# Patient Record
Sex: Male | Born: 1950 | Race: Black or African American | Hispanic: No | Marital: Single | Smoking: Never smoker
Health system: Southern US, Community
[De-identification: ages and names within clinical notes are randomized; demographics above are authoritative.]

## PROBLEM LIST (undated history)

## (undated) DIAGNOSIS — I10 Essential (primary) hypertension: Secondary | ICD-10-CM

## (undated) DIAGNOSIS — C801 Malignant (primary) neoplasm, unspecified: Secondary | ICD-10-CM

## (undated) HISTORY — PX: PROSTATE SURGERY: SHX751

## (undated) HISTORY — PX: BRAIN SURGERY: SHX531

---

## 2006-10-05 ENCOUNTER — Emergency Department: Payer: Self-pay | Admitting: Emergency Medicine

## 2007-08-05 ENCOUNTER — Other Ambulatory Visit: Payer: Self-pay

## 2007-08-05 ENCOUNTER — Emergency Department: Payer: Self-pay | Admitting: Emergency Medicine

## 2008-01-18 ENCOUNTER — Ambulatory Visit: Payer: Self-pay | Admitting: Family Medicine

## 2008-05-02 ENCOUNTER — Emergency Department: Payer: Self-pay | Admitting: Emergency Medicine

## 2008-05-17 ENCOUNTER — Ambulatory Visit: Payer: Self-pay | Admitting: Specialist

## 2008-06-07 ENCOUNTER — Ambulatory Visit: Payer: Self-pay | Admitting: Internal Medicine

## 2008-06-15 ENCOUNTER — Ambulatory Visit: Payer: Self-pay | Admitting: Specialist

## 2008-06-23 ENCOUNTER — Ambulatory Visit: Payer: Self-pay | Admitting: Internal Medicine

## 2008-07-08 ENCOUNTER — Ambulatory Visit: Payer: Self-pay | Admitting: Internal Medicine

## 2008-07-29 ENCOUNTER — Emergency Department: Payer: Self-pay | Admitting: Emergency Medicine

## 2008-07-29 ENCOUNTER — Other Ambulatory Visit: Payer: Self-pay

## 2008-08-08 ENCOUNTER — Ambulatory Visit: Payer: Self-pay | Admitting: Internal Medicine

## 2008-09-05 ENCOUNTER — Emergency Department: Payer: Self-pay | Admitting: Emergency Medicine

## 2008-09-07 ENCOUNTER — Ambulatory Visit: Payer: Self-pay | Admitting: Internal Medicine

## 2008-10-03 ENCOUNTER — Ambulatory Visit: Payer: Self-pay | Admitting: Internal Medicine

## 2008-10-08 ENCOUNTER — Ambulatory Visit: Payer: Self-pay | Admitting: Internal Medicine

## 2009-10-12 ENCOUNTER — Encounter: Payer: Self-pay | Admitting: Internal Medicine

## 2010-05-28 ENCOUNTER — Emergency Department: Payer: Self-pay | Admitting: Emergency Medicine

## 2010-06-18 ENCOUNTER — Observation Stay: Payer: Self-pay | Admitting: Internal Medicine

## 2010-10-24 ENCOUNTER — Ambulatory Visit: Payer: Self-pay

## 2010-11-21 ENCOUNTER — Emergency Department: Payer: Self-pay

## 2011-02-28 ENCOUNTER — Ambulatory Visit: Payer: Self-pay | Admitting: Unknown Physician Specialty

## 2011-03-27 ENCOUNTER — Ambulatory Visit: Payer: Self-pay | Admitting: Unknown Physician Specialty

## 2011-03-28 ENCOUNTER — Ambulatory Visit: Payer: Self-pay | Admitting: Unknown Physician Specialty

## 2011-11-14 ENCOUNTER — Ambulatory Visit: Payer: Self-pay | Admitting: Surgery

## 2011-11-24 ENCOUNTER — Emergency Department: Payer: Self-pay | Admitting: Emergency Medicine

## 2012-07-11 ENCOUNTER — Emergency Department: Payer: Self-pay | Admitting: Unknown Physician Specialty

## 2012-07-11 LAB — CBC WITH DIFFERENTIAL/PLATELET
Basophil #: 0.6 10*3/uL — ABNORMAL HIGH (ref 0.0–0.1)
Basophil %: 3.5 %
Eosinophil #: 0.1 10*3/uL (ref 0.0–0.7)
Eosinophil %: 0.3 %
HCT: 36.1 % — ABNORMAL LOW (ref 40.0–52.0)
HGB: 11.4 g/dL — ABNORMAL LOW (ref 13.0–18.0)
Lymphocyte %: 7.5 %
MCV: 80 fL (ref 80–100)
Neutrophil #: 13 10*3/uL — ABNORMAL HIGH (ref 1.4–6.5)
RDW: 15.2 % — ABNORMAL HIGH (ref 11.5–14.5)
WBC: 15.8 10*3/uL — ABNORMAL HIGH (ref 3.8–10.6)

## 2012-07-11 LAB — PROTIME-INR
INR: 1.1
Prothrombin Time: 14.2 secs (ref 11.5–14.7)

## 2012-07-11 LAB — COMPREHENSIVE METABOLIC PANEL
Anion Gap: 10 (ref 7–16)
Bilirubin,Total: 0.7 mg/dL (ref 0.2–1.0)
Chloride: 103 mmol/L (ref 98–107)
Co2: 26 mmol/L (ref 21–32)
Creatinine: 0.92 mg/dL (ref 0.60–1.30)
EGFR (African American): >60
EGFR (Non-African Amer.): >60
Osmolality: 277 (ref 275–301)
Potassium: 3.7 mmol/L (ref 3.5–5.1)
SGPT (ALT): 35 U/L (ref 12–78)
Sodium: 139 mmol/L (ref 136–145)
Total Protein: 8.6 g/dL — ABNORMAL HIGH (ref 6.4–8.2)

## 2012-07-11 LAB — URINALYSIS, COMPLETE
Bilirubin,UR: NEGATIVE
Glucose,UR: NEGATIVE mg/dL (ref 0–75)
Nitrite: POSITIVE
RBC,UR: 37 /HPF (ref 0–5)
Specific Gravity: 1.009 (ref 1.003–1.030)
Squamous Epithelial: NONE SEEN

## 2012-07-11 LAB — TROPONIN I: Troponin-I: <0.02 ng/mL

## 2012-07-11 LAB — MAGNESIUM: Magnesium: 2 mg/dL

## 2012-07-13 LAB — URINE CULTURE

## 2012-07-17 LAB — CULTURE, BLOOD (SINGLE)

## 2012-08-22 ENCOUNTER — Emergency Department: Payer: Self-pay | Admitting: Emergency Medicine

## 2012-08-22 LAB — URINALYSIS, COMPLETE
Bilirubin,UR: NEGATIVE
Nitrite: NEGATIVE
Ph: 6 (ref 4.5–8.0)
Protein: 30
Squamous Epithelial: 1

## 2012-08-22 LAB — BASIC METABOLIC PANEL
BUN: 11 mg/dL (ref 7–18)
Chloride: 102 mmol/L (ref 98–107)
Creatinine: 0.65 mg/dL (ref 0.60–1.30)
EGFR (African American): >60
EGFR (Non-African Amer.): >60
Glucose: 79 mg/dL (ref 65–99)
Osmolality: 274 (ref 275–301)
Potassium: 3.5 mmol/L (ref 3.5–5.1)
Sodium: 138 mmol/L (ref 136–145)

## 2012-08-22 LAB — CBC
HGB: 10.5 g/dL — ABNORMAL LOW (ref 13.0–18.0)
MCH: 24 pg — ABNORMAL LOW (ref 26.0–34.0)
MCHC: 31.3 g/dL — ABNORMAL LOW (ref 32.0–36.0)
MCV: 77 fL — ABNORMAL LOW (ref 80–100)
Platelet: 330 10*3/uL (ref 150–440)
RDW: 15.7 % — ABNORMAL HIGH (ref 11.5–14.5)

## 2012-08-23 LAB — URINE CULTURE

## 2012-12-10 ENCOUNTER — Emergency Department: Payer: Self-pay | Admitting: Emergency Medicine

## 2012-12-10 LAB — URINALYSIS, COMPLETE
Bilirubin,UR: NEGATIVE
Ketone: NEGATIVE
Nitrite: NEGATIVE
Protein: NEGATIVE
Specific Gravity: 1.008 (ref 1.003–1.030)
WBC UR: 119 /HPF (ref 0–5)

## 2012-12-10 LAB — COMPREHENSIVE METABOLIC PANEL
Albumin: 3 g/dL — ABNORMAL LOW (ref 3.4–5.0)
Alkaline Phosphatase: 125 U/L (ref 50–136)
Bilirubin,Total: 0.3 mg/dL (ref 0.2–1.0)
Calcium, Total: 9 mg/dL (ref 8.5–10.1)
Chloride: 107 mmol/L (ref 98–107)
Co2: 25 mmol/L (ref 21–32)
Creatinine: 1.14 mg/dL (ref 0.60–1.30)
EGFR (African American): >60
EGFR (Non-African Amer.): >60
Osmolality: 276 (ref 275–301)
SGOT(AST): 25 U/L (ref 15–37)
Total Protein: 7.6 g/dL (ref 6.4–8.2)

## 2012-12-10 LAB — CBC
HCT: 29.6 % — ABNORMAL LOW (ref 40.0–52.0)
MCHC: 32 g/dL (ref 32.0–36.0)
MCV: 89 fL (ref 80–100)
Platelet: 339 10*3/uL (ref 150–440)
RDW: 23.8 % — ABNORMAL HIGH (ref 11.5–14.5)

## 2012-12-15 LAB — URINE CULTURE

## 2013-09-01 ENCOUNTER — Ambulatory Visit: Payer: Self-pay | Admitting: Internal Medicine

## 2013-09-02 ENCOUNTER — Ambulatory Visit: Payer: Self-pay | Admitting: Internal Medicine

## 2013-09-07 ENCOUNTER — Ambulatory Visit: Payer: Self-pay | Admitting: Internal Medicine

## 2013-09-16 ENCOUNTER — Ambulatory Visit: Payer: Self-pay | Admitting: Internal Medicine

## 2013-10-08 ENCOUNTER — Ambulatory Visit: Payer: Self-pay | Admitting: Internal Medicine

## 2013-10-08 ENCOUNTER — Inpatient Hospital Stay: Payer: Self-pay | Admitting: Internal Medicine

## 2013-10-08 LAB — DIFFERENTIAL
Basophil #: 0 10*3/uL (ref 0.0–0.1)
Basophil %: 1 %
Eosinophil #: 0 10*3/uL (ref 0.0–0.7)
Eosinophil %: 0.1 %
Lymphocyte %: 50.5 %
Monocyte #: 0.2 x10 3/mm (ref 0.2–1.0)
Monocyte %: 34.7 %
Neutrophil %: 13.7 %

## 2013-10-08 LAB — COMPREHENSIVE METABOLIC PANEL
Albumin: 2.6 g/dL — ABNORMAL LOW (ref 3.4–5.0)
Alkaline Phosphatase: 89 U/L (ref 50–136)
Anion Gap: 10 (ref 7–16)
BUN: 30 mg/dL — ABNORMAL HIGH (ref 7–18)
Bilirubin,Total: 0.6 mg/dL (ref 0.2–1.0)
Chloride: 97 mmol/L — ABNORMAL LOW (ref 98–107)
Co2: 27 mmol/L (ref 21–32)
Creatinine: 2.51 mg/dL — ABNORMAL HIGH (ref 0.60–1.30)
EGFR (African American): 31 — ABNORMAL LOW
EGFR (Non-African Amer.): 27 — ABNORMAL LOW
Glucose: 112 mg/dL — ABNORMAL HIGH (ref 65–99)
Osmolality: 275 (ref 275–301)
Potassium: 3 mmol/L — ABNORMAL LOW (ref 3.5–5.1)
SGOT(AST): 11 U/L — ABNORMAL LOW (ref 15–37)
SGPT (ALT): 23 U/L (ref 12–78)
Total Protein: 6.7 g/dL (ref 6.4–8.2)

## 2013-10-08 LAB — URINALYSIS, COMPLETE
Bilirubin,UR: NEGATIVE
Glucose,UR: NEGATIVE mg/dL (ref 0–75)
Ketone: NEGATIVE
Nitrite: NEGATIVE
Ph: 8 (ref 4.5–8.0)
Protein: 100
RBC,UR: 4 /HPF (ref 0–5)
Specific Gravity: 1.009 (ref 1.003–1.030)
Squamous Epithelial: NONE SEEN

## 2013-10-08 LAB — CBC
HCT: 28 % — ABNORMAL LOW (ref 40.0–52.0)
MCHC: 32.7 g/dL (ref 32.0–36.0)
MCV: 81 fL (ref 80–100)
Platelet: 48 10*3/uL — ABNORMAL LOW (ref 150–440)
RBC: 3.47 10*6/uL — ABNORMAL LOW (ref 4.40–5.90)
RDW: 15.9 % — ABNORMAL HIGH (ref 11.5–14.5)

## 2013-10-08 LAB — ACETAMINOPHEN LEVEL: Acetaminophen: <2 ug/mL

## 2013-10-08 LAB — PRO B NATRIURETIC PEPTIDE: B-Type Natriuretic Peptide: 628 pg/mL — ABNORMAL HIGH (ref 0–125)

## 2013-10-08 LAB — SALICYLATE LEVEL: Salicylates, Serum: <1.7 mg/dL

## 2013-10-08 LAB — PROTIME-INR
INR: 3.4
Prothrombin Time: 33 secs — ABNORMAL HIGH (ref 11.5–14.7)

## 2013-10-08 LAB — TROPONIN I: Troponin-I: 0.04 ng/mL

## 2013-10-09 LAB — CBC WITH DIFFERENTIAL/PLATELET
Basophil %: 0.5 %
Eosinophil %: 0.1 %
HCT: 23.9 % — ABNORMAL LOW (ref 40.0–52.0)
HGB: 7.8 g/dL — ABNORMAL LOW (ref 13.0–18.0)
Lymphocyte %: 27.8 %
MCH: 26.9 pg (ref 26.0–34.0)
MCHC: 32.8 g/dL (ref 32.0–36.0)
MCV: 82 fL (ref 80–100)
Monocyte %: 12.1 %
Neutrophil #: 0.5 10*3/uL — ABNORMAL LOW (ref 1.4–6.5)
WBC: 0.9 10*3/uL — CL (ref 3.8–10.6)

## 2013-10-09 LAB — COMPREHENSIVE METABOLIC PANEL
Albumin: 2.1 g/dL — ABNORMAL LOW (ref 3.4–5.0)
Alkaline Phosphatase: 79 U/L (ref 50–136)
Anion Gap: 5 — ABNORMAL LOW (ref 7–16)
EGFR (Non-African Amer.): 28 — ABNORMAL LOW
Glucose: 78 mg/dL (ref 65–99)
Osmolality: 283 (ref 275–301)
Potassium: 3.7 mmol/L (ref 3.5–5.1)
SGOT(AST): 7 U/L — ABNORMAL LOW (ref 15–37)
SGPT (ALT): 19 U/L (ref 12–78)
Sodium: 140 mmol/L (ref 136–145)

## 2013-10-10 LAB — BASIC METABOLIC PANEL
Anion Gap: 7 (ref 7–16)
BUN: 24 mg/dL — ABNORMAL HIGH (ref 7–18)
Calcium, Total: 7.1 mg/dL — ABNORMAL LOW (ref 8.5–10.1)
Chloride: 111 mmol/L — ABNORMAL HIGH (ref 98–107)
Co2: 22 mmol/L (ref 21–32)
EGFR (African American): 42 — ABNORMAL LOW
EGFR (Non-African Amer.): 36 — ABNORMAL LOW
Glucose: 81 mg/dL (ref 65–99)
Osmolality: 282 (ref 275–301)
Potassium: 3.2 mmol/L — ABNORMAL LOW (ref 3.5–5.1)

## 2013-10-10 LAB — PROTIME-INR
INR: 4.7
Prothrombin Time: 42.4 secs — ABNORMAL HIGH (ref 11.5–14.7)

## 2013-10-11 LAB — CBC WITH DIFFERENTIAL/PLATELET
Bands: 26 %
Basophil: 1 %
HCT: 23.5 % — ABNORMAL LOW (ref 40.0–52.0)
Metamyelocyte: 1 %
Monocytes: 5 %
RDW: 16.2 % — ABNORMAL HIGH (ref 11.5–14.5)
Segmented Neutrophils: 59 %
WBC: 9.2 10*3/uL (ref 3.8–10.6)

## 2013-10-11 LAB — PROTIME-INR: INR: 4

## 2013-10-13 LAB — CULTURE, BLOOD (SINGLE)

## 2013-10-13 LAB — URINE CULTURE

## 2013-10-22 ENCOUNTER — Emergency Department: Payer: Self-pay | Admitting: Internal Medicine

## 2013-10-26 ENCOUNTER — Ambulatory Visit: Payer: Self-pay | Admitting: Internal Medicine

## 2013-11-07 ENCOUNTER — Ambulatory Visit: Payer: Self-pay | Admitting: Internal Medicine

## 2013-12-08 ENCOUNTER — Ambulatory Visit: Payer: Self-pay | Admitting: Internal Medicine

## 2013-12-08 ENCOUNTER — Ambulatory Visit: Payer: Self-pay | Admitting: Gynecologic Oncology

## 2013-12-20 LAB — CBC CANCER CENTER
Basophil #: 0 x10 3/mm (ref 0.0–0.1)
Basophil %: 0.3 %
Eosinophil #: 0 x10 3/mm (ref 0.0–0.7)
Eosinophil %: 0.6 %
HCT: 32.2 % — AB (ref 40.0–52.0)
HGB: 10.4 g/dL — AB (ref 13.0–18.0)
LYMPHS ABS: 1.3 x10 3/mm (ref 1.0–3.6)
Lymphocyte %: 18 %
MCH: 28.1 pg (ref 26.0–34.0)
MCHC: 32.2 g/dL (ref 32.0–36.0)
MCV: 87 fL (ref 80–100)
MONOS PCT: 11.1 %
Monocyte #: 0.8 x10 3/mm (ref 0.2–1.0)
NEUTROS ABS: 5 x10 3/mm (ref 1.4–6.5)
NEUTROS PCT: 70 %
PLATELETS: 196 x10 3/mm (ref 150–440)
RBC: 3.7 10*6/uL — ABNORMAL LOW (ref 4.40–5.90)
RDW: 18.3 % — ABNORMAL HIGH (ref 11.5–14.5)
WBC: 7.1 x10 3/mm (ref 3.8–10.6)

## 2013-12-20 LAB — COMPREHENSIVE METABOLIC PANEL
Albumin: 3.5 g/dL (ref 3.4–5.0)
Alkaline Phosphatase: 147 U/L — ABNORMAL HIGH
Anion Gap: 5 — ABNORMAL LOW (ref 7–16)
BUN: 23 mg/dL — ABNORMAL HIGH (ref 7–18)
Bilirubin,Total: 0.2 mg/dL (ref 0.2–1.0)
CALCIUM: 9.6 mg/dL (ref 8.5–10.1)
CO2: 27 mmol/L (ref 21–32)
Chloride: 109 mmol/L — ABNORMAL HIGH (ref 98–107)
Creatinine: 1.64 mg/dL — ABNORMAL HIGH (ref 0.60–1.30)
EGFR (African American): 52 — ABNORMAL LOW
EGFR (Non-African Amer.): 44 — ABNORMAL LOW
Glucose: 88 mg/dL (ref 65–99)
OSMOLALITY: 284 (ref 275–301)
POTASSIUM: 4.2 mmol/L (ref 3.5–5.1)
SGOT(AST): 15 U/L (ref 15–37)
SGPT (ALT): 18 U/L (ref 12–78)
Sodium: 141 mmol/L (ref 136–145)
Total Protein: 7.7 g/dL (ref 6.4–8.2)

## 2013-12-27 LAB — CBC CANCER CENTER
BASOS ABS: 0 x10 3/mm (ref 0.0–0.1)
Basophil %: 0.9 %
Eosinophil #: 0 x10 3/mm (ref 0.0–0.7)
Eosinophil %: 0.8 %
HCT: 32.7 % — AB (ref 40.0–52.0)
HGB: 10.4 g/dL — ABNORMAL LOW (ref 13.0–18.0)
Lymphocyte #: 1.2 x10 3/mm (ref 1.0–3.6)
Lymphocyte %: 29.8 %
MCH: 27.6 pg (ref 26.0–34.0)
MCHC: 31.7 g/dL — ABNORMAL LOW (ref 32.0–36.0)
MCV: 87 fL (ref 80–100)
Monocyte #: 0.1 x10 3/mm — ABNORMAL LOW (ref 0.2–1.0)
Monocyte %: 3.5 %
Neutrophil #: 2.7 x10 3/mm (ref 1.4–6.5)
Neutrophil %: 65 %
PLATELETS: 132 x10 3/mm — AB (ref 150–440)
RBC: 3.75 10*6/uL — ABNORMAL LOW (ref 4.40–5.90)
RDW: 17.6 % — ABNORMAL HIGH (ref 11.5–14.5)
WBC: 4.1 x10 3/mm (ref 3.8–10.6)

## 2014-01-02 ENCOUNTER — Emergency Department: Payer: Self-pay | Admitting: Emergency Medicine

## 2014-01-02 LAB — URINALYSIS, COMPLETE
Bilirubin,UR: NEGATIVE
Glucose,UR: NEGATIVE mg/dL (ref 0–75)
Ketone: NEGATIVE
Nitrite: NEGATIVE
PH: 7 (ref 4.5–8.0)
Protein: 30
RBC,UR: 6 /HPF (ref 0–5)
Specific Gravity: 1.011 (ref 1.003–1.030)
WBC UR: 171 /HPF (ref 0–5)

## 2014-01-02 LAB — CBC WITH DIFFERENTIAL/PLATELET
Basophil #: 0 10*3/uL (ref 0.0–0.1)
Basophil %: 0.9 %
EOS PCT: 1.3 %
Eosinophil #: 0 10*3/uL (ref 0.0–0.7)
Eosinophil: 1 %
HCT: 31 % — ABNORMAL LOW (ref 40.0–52.0)
HGB: 10 g/dL — AB (ref 13.0–18.0)
LYMPHS PCT: 43.3 %
LYMPHS PCT: 47 %
Lymphocyte #: 1.5 10*3/uL (ref 1.0–3.6)
MCH: 28.3 pg (ref 26.0–34.0)
MCHC: 32.4 g/dL (ref 32.0–36.0)
MCV: 87 fL (ref 80–100)
MONOS PCT: 28.1 %
Monocyte #: 1 x10 3/mm (ref 0.2–1.0)
Monocytes: 23 %
NEUTROS PCT: 26.4 %
Neutrophil #: 0.9 10*3/uL — ABNORMAL LOW (ref 1.4–6.5)
Platelet: 175 10*3/uL (ref 150–440)
RBC: 3.55 10*6/uL — ABNORMAL LOW (ref 4.40–5.90)
RDW: 17.7 % — AB (ref 11.5–14.5)
Segmented Neutrophils: 29 %
WBC: 3.4 10*3/uL — ABNORMAL LOW (ref 3.8–10.6)

## 2014-01-02 LAB — COMPREHENSIVE METABOLIC PANEL
ALK PHOS: 126 U/L — AB
ALT: 23 U/L (ref 12–78)
ANION GAP: 6 — AB (ref 7–16)
Albumin: 3.4 g/dL (ref 3.4–5.0)
BILIRUBIN TOTAL: 0.2 mg/dL (ref 0.2–1.0)
BUN: 21 mg/dL — AB (ref 7–18)
CO2: 24 mmol/L (ref 21–32)
CREATININE: 1.52 mg/dL — AB (ref 0.60–1.30)
Calcium, Total: 9.4 mg/dL (ref 8.5–10.1)
Chloride: 105 mmol/L (ref 98–107)
EGFR (Non-African Amer.): 49 — ABNORMAL LOW
GFR CALC AF AMER: 57 — AB
GLUCOSE: 109 mg/dL — AB (ref 65–99)
Osmolality: 274 (ref 275–301)
Potassium: 3.8 mmol/L (ref 3.5–5.1)
SGOT(AST): 22 U/L (ref 15–37)
SODIUM: 135 mmol/L — AB (ref 136–145)
Total Protein: 7.8 g/dL (ref 6.4–8.2)

## 2014-01-02 LAB — TROPONIN I: Troponin-I: <0.02 ng/mL

## 2014-01-03 LAB — CREATININE, SERUM
Creatinine: 1.47 mg/dL — ABNORMAL HIGH (ref 0.60–1.30)
EGFR (African American): 59 — ABNORMAL LOW
EGFR (Non-African Amer.): 51 — ABNORMAL LOW

## 2014-01-03 LAB — CBC CANCER CENTER
BASOS ABS: 0 x10 3/mm (ref 0.0–0.1)
Basophil %: 0.8 %
EOS ABS: 0 x10 3/mm (ref 0.0–0.7)
Eosinophil %: 1.1 %
HCT: 30.3 % — AB (ref 40.0–52.0)
HGB: 9.6 g/dL — ABNORMAL LOW (ref 13.0–18.0)
LYMPHS ABS: 1.3 x10 3/mm (ref 1.0–3.6)
LYMPHS PCT: 41 %
MCH: 27.4 pg (ref 26.0–34.0)
MCHC: 31.5 g/dL — ABNORMAL LOW (ref 32.0–36.0)
MCV: 87 fL (ref 80–100)
Monocyte #: 0.7 x10 3/mm (ref 0.2–1.0)
Monocyte %: 21.2 %
Neutrophil #: 1.2 x10 3/mm — ABNORMAL LOW (ref 1.4–6.5)
Neutrophil %: 35.9 %
PLATELETS: 168 x10 3/mm (ref 150–440)
RBC: 3.49 10*6/uL — ABNORMAL LOW (ref 4.40–5.90)
RDW: 17.8 % — AB (ref 11.5–14.5)
WBC: 3.3 x10 3/mm — ABNORMAL LOW (ref 3.8–10.6)

## 2014-01-03 LAB — HEPATIC FUNCTION PANEL A (ARMC)
ALK PHOS: 117 U/L
Albumin: 3.2 g/dL — ABNORMAL LOW (ref 3.4–5.0)
BILIRUBIN TOTAL: 0.2 mg/dL (ref 0.2–1.0)
Bilirubin, Direct: 0.1 mg/dL (ref 0.00–0.20)
SGOT(AST): 17 U/L (ref 15–37)
SGPT (ALT): 21 U/L (ref 12–78)
Total Protein: 7.5 g/dL (ref 6.4–8.2)

## 2014-01-04 LAB — URINE CULTURE

## 2014-01-08 ENCOUNTER — Ambulatory Visit: Payer: Self-pay | Admitting: Internal Medicine

## 2014-01-10 LAB — BASIC METABOLIC PANEL
ANION GAP: 13 (ref 7–16)
BUN: 17 mg/dL (ref 7–18)
CO2: 24 mmol/L (ref 21–32)
Calcium, Total: 8.6 mg/dL (ref 8.5–10.1)
Chloride: 105 mmol/L (ref 98–107)
Creatinine: 1.56 mg/dL — ABNORMAL HIGH (ref 0.60–1.30)
GFR CALC AF AMER: 55 — AB
GFR CALC NON AF AMER: 47 — AB
GLUCOSE: 121 mg/dL — AB (ref 65–99)
OSMOLALITY: 286 (ref 275–301)
Potassium: 3.5 mmol/L (ref 3.5–5.1)
Sodium: 142 mmol/L (ref 136–145)

## 2014-01-10 LAB — URINALYSIS, COMPLETE
Bilirubin,UR: NEGATIVE
Glucose,UR: NEGATIVE mg/dL (ref 0–75)
KETONE: NEGATIVE
Nitrite: NEGATIVE
PH: 7 (ref 4.5–8.0)
Protein: 30
SQUAMOUS EPITHELIAL: NONE SEEN
Specific Gravity: 1.01 (ref 1.003–1.030)
WBC UR: 1155 /HPF (ref 0–5)

## 2014-01-10 LAB — CBC CANCER CENTER
BASOS PCT: 0.3 %
Basophil #: 0 x10 3/mm (ref 0.0–0.1)
EOS PCT: 0.6 %
Eosinophil #: 0 x10 3/mm (ref 0.0–0.7)
HCT: 28.9 % — ABNORMAL LOW (ref 40.0–52.0)
HGB: 9.1 g/dL — ABNORMAL LOW (ref 13.0–18.0)
LYMPHS ABS: 1.6 x10 3/mm (ref 1.0–3.6)
LYMPHS PCT: 24.7 %
MCH: 27.4 pg (ref 26.0–34.0)
MCHC: 31.4 g/dL — ABNORMAL LOW (ref 32.0–36.0)
MCV: 87 fL (ref 80–100)
MONOS PCT: 9.4 %
Monocyte #: 0.6 x10 3/mm (ref 0.2–1.0)
NEUTROS PCT: 65 %
Neutrophil #: 4.1 x10 3/mm (ref 1.4–6.5)
Platelet: 181 x10 3/mm (ref 150–440)
RBC: 3.31 10*6/uL — ABNORMAL LOW (ref 4.40–5.90)
RDW: 18.7 % — ABNORMAL HIGH (ref 11.5–14.5)
WBC: 6.4 x10 3/mm (ref 3.8–10.6)

## 2014-01-12 ENCOUNTER — Inpatient Hospital Stay: Payer: Self-pay | Admitting: Internal Medicine

## 2014-01-12 LAB — CBC WITH DIFFERENTIAL/PLATELET
BASOS ABS: 0 10*3/uL (ref 0.0–0.1)
Basophil %: 0.4 %
Eosinophil #: 0 10*3/uL (ref 0.0–0.7)
Eosinophil %: 0 %
HCT: 26.7 % — AB (ref 40.0–52.0)
HGB: 8.7 g/dL — ABNORMAL LOW (ref 13.0–18.0)
Lymphocyte #: 0.9 10*3/uL — ABNORMAL LOW (ref 1.0–3.6)
Lymphocyte %: 10.5 %
MCH: 28.3 pg (ref 26.0–34.0)
MCHC: 32.5 g/dL (ref 32.0–36.0)
MCV: 87 fL (ref 80–100)
MONOS PCT: 6 %
Monocyte #: 0.5 x10 3/mm (ref 0.2–1.0)
Neutrophil #: 7.4 10*3/uL — ABNORMAL HIGH (ref 1.4–6.5)
Neutrophil %: 83.1 %
PLATELETS: 162 10*3/uL (ref 150–440)
RBC: 3.06 10*6/uL — AB (ref 4.40–5.90)
RDW: 18.2 % — ABNORMAL HIGH (ref 11.5–14.5)
WBC: 8.9 10*3/uL (ref 3.8–10.6)

## 2014-01-12 LAB — COMPREHENSIVE METABOLIC PANEL
ALBUMIN: 3.1 g/dL — AB (ref 3.4–5.0)
ALT: 27 U/L (ref 12–78)
ANION GAP: 6 — AB (ref 7–16)
Alkaline Phosphatase: 115 U/L
BILIRUBIN TOTAL: 0.2 mg/dL (ref 0.2–1.0)
BUN: 27 mg/dL — ABNORMAL HIGH (ref 7–18)
CALCIUM: 9 mg/dL (ref 8.5–10.1)
Chloride: 106 mmol/L (ref 98–107)
Co2: 28 mmol/L (ref 21–32)
Creatinine: 1.8 mg/dL — ABNORMAL HIGH (ref 0.60–1.30)
GFR CALC AF AMER: 46 — AB
GFR CALC NON AF AMER: 40 — AB
Glucose: 103 mg/dL — ABNORMAL HIGH (ref 65–99)
Osmolality: 285 (ref 275–301)
Potassium: 3.6 mmol/L (ref 3.5–5.1)
SGOT(AST): 36 U/L (ref 15–37)
Sodium: 140 mmol/L (ref 136–145)
TOTAL PROTEIN: 7.1 g/dL (ref 6.4–8.2)

## 2014-01-12 LAB — URINALYSIS, COMPLETE
Bilirubin,UR: NEGATIVE
Glucose,UR: NEGATIVE mg/dL (ref 0–75)
KETONE: NEGATIVE
NITRITE: NEGATIVE
PROTEIN: NEGATIVE
Ph: 7 (ref 4.5–8.0)
Specific Gravity: 1.01 (ref 1.003–1.030)
Squamous Epithelial: <1
WBC UR: 178 /HPF (ref 0–5)

## 2014-01-12 LAB — APTT: ACTIVATED PTT: 37.7 s — AB (ref 23.6–35.9)

## 2014-01-12 LAB — PROTIME-INR
INR: 2.1
Prothrombin Time: 23.4 secs — ABNORMAL HIGH (ref 11.5–14.7)

## 2014-01-12 LAB — HEMATOCRIT
HCT: 25.3 % — AB (ref 40.0–52.0)
HCT: 24.3 % — ABNORMAL LOW (ref 40.0–52.0)

## 2014-01-12 LAB — LIPASE, BLOOD: LIPASE: 61 U/L — AB (ref 73–393)

## 2014-01-12 LAB — HEMOGLOBIN
HGB: 8.1 g/dL — AB (ref 13.0–18.0)
HGB: 7.9 g/dL — AB (ref 13.0–18.0)
HGB: 8.3 g/dL — AB (ref 13.0–18.0)

## 2014-01-12 LAB — URINE CULTURE

## 2014-01-13 LAB — BASIC METABOLIC PANEL
Anion Gap: 2 — ABNORMAL LOW (ref 7–16)
BUN: 15 mg/dL (ref 7–18)
CHLORIDE: 106 mmol/L (ref 98–107)
Calcium, Total: 8.4 mg/dL — ABNORMAL LOW (ref 8.5–10.1)
Co2: 28 mmol/L (ref 21–32)
Creatinine: 1.34 mg/dL — ABNORMAL HIGH (ref 0.60–1.30)
EGFR (Non-African Amer.): 57 — ABNORMAL LOW
GLUCOSE: 115 mg/dL — AB (ref 65–99)
Osmolality: 274 (ref 275–301)
Potassium: 4 mmol/L (ref 3.5–5.1)
Sodium: 136 mmol/L (ref 136–145)

## 2014-01-13 LAB — PROTIME-INR
INR: 2.3
Prothrombin Time: 25.1 secs — ABNORMAL HIGH (ref 11.5–14.7)

## 2014-01-13 LAB — CBC WITH DIFFERENTIAL/PLATELET
Basophil #: 0 10*3/uL (ref 0.0–0.1)
Basophil %: 0.6 %
EOS ABS: 0 10*3/uL (ref 0.0–0.7)
EOS PCT: 0.5 %
HCT: 25.4 % — ABNORMAL LOW (ref 40.0–52.0)
HGB: 8.4 g/dL — ABNORMAL LOW (ref 13.0–18.0)
LYMPHS ABS: 1.2 10*3/uL (ref 1.0–3.6)
LYMPHS PCT: 34.3 %
MCH: 28.8 pg (ref 26.0–34.0)
MCHC: 32.9 g/dL (ref 32.0–36.0)
MCV: 88 fL (ref 80–100)
MONO ABS: 0.1 x10 3/mm — AB (ref 0.2–1.0)
Monocyte %: 2.6 %
NEUTROS PCT: 62 %
Neutrophil #: 2.2 10*3/uL (ref 1.4–6.5)
Platelet: 133 10*3/uL — ABNORMAL LOW (ref 150–440)
RBC: 2.9 10*6/uL — AB (ref 4.40–5.90)
RDW: 18.8 % — ABNORMAL HIGH (ref 11.5–14.5)
WBC: 3.5 10*3/uL — ABNORMAL LOW (ref 3.8–10.6)

## 2014-01-13 LAB — HEMOGLOBIN: HGB: 8.6 g/dL — ABNORMAL LOW (ref 13.0–18.0)

## 2014-01-13 LAB — MAGNESIUM: Magnesium: 1.3 mg/dL — ABNORMAL LOW

## 2014-01-14 LAB — CBC WITH DIFFERENTIAL/PLATELET
Basophil #: 0 10*3/uL (ref 0.0–0.1)
Basophil %: 0.8 %
Eosinophil #: 0 10*3/uL (ref 0.0–0.7)
Eosinophil %: 0.8 %
HCT: 28.9 % — ABNORMAL LOW (ref 40.0–52.0)
HGB: 9.2 g/dL — ABNORMAL LOW (ref 13.0–18.0)
Lymphocyte #: 0.9 10*3/uL — ABNORMAL LOW (ref 1.0–3.6)
Lymphocyte %: 18 %
MCH: 27.7 pg (ref 26.0–34.0)
MCHC: 31.9 g/dL — AB (ref 32.0–36.0)
MCV: 87 fL (ref 80–100)
Monocyte #: 0 x10 3/mm — ABNORMAL LOW (ref 0.2–1.0)
Monocyte %: 0.9 %
NEUTROS ABS: 3.8 10*3/uL (ref 1.4–6.5)
Neutrophil %: 79.5 %
PLATELETS: 141 10*3/uL — AB (ref 150–440)
RBC: 3.33 10*6/uL — ABNORMAL LOW (ref 4.40–5.90)
RDW: 18.6 % — ABNORMAL HIGH (ref 11.5–14.5)
WBC: 4.8 10*3/uL (ref 3.8–10.6)

## 2014-01-15 LAB — PROTIME-INR
INR: 1.9
Prothrombin Time: 21.4 secs — ABNORMAL HIGH (ref 11.5–14.7)

## 2014-01-15 LAB — URINE CULTURE

## 2014-01-17 ENCOUNTER — Ambulatory Visit: Payer: Self-pay | Admitting: Gynecologic Oncology

## 2014-01-17 LAB — CBC CANCER CENTER
Basophil #: 0 x10 3/mm (ref 0.0–0.1)
Basophil %: 0.7 %
Eosinophil #: 0 x10 3/mm (ref 0.0–0.7)
Eosinophil %: 0.8 %
HCT: 29.9 % — ABNORMAL LOW (ref 40.0–52.0)
HGB: 9.5 g/dL — ABNORMAL LOW (ref 13.0–18.0)
Lymphocyte #: 1.3 x10 3/mm (ref 1.0–3.6)
Lymphocyte %: 39.2 %
MCH: 27.4 pg (ref 26.0–34.0)
MCHC: 31.9 g/dL — ABNORMAL LOW (ref 32.0–36.0)
MCV: 86 fL (ref 80–100)
Monocyte #: 0.1 x10 3/mm — ABNORMAL LOW (ref 0.2–1.0)
Monocyte %: 1.9 %
Neutrophil #: 1.9 x10 3/mm (ref 1.4–6.5)
Neutrophil %: 57.4 %
Platelet: 116 x10 3/mm — ABNORMAL LOW (ref 150–440)
RBC: 3.47 10*6/uL — ABNORMAL LOW (ref 4.40–5.90)
RDW: 18.4 % — ABNORMAL HIGH (ref 11.5–14.5)
WBC: 3.3 x10 3/mm — ABNORMAL LOW (ref 3.8–10.6)

## 2014-01-31 LAB — HEPATIC FUNCTION PANEL A (ARMC)
ALK PHOS: 136 U/L — AB
ALT: 30 U/L (ref 12–78)
AST: 22 U/L (ref 15–37)
Albumin: 3.2 g/dL — ABNORMAL LOW (ref 3.4–5.0)
Bilirubin, Direct: 0.1 mg/dL (ref 0.00–0.20)
Bilirubin,Total: 0.2 mg/dL (ref 0.2–1.0)
Total Protein: 7.2 g/dL (ref 6.4–8.2)

## 2014-01-31 LAB — CBC CANCER CENTER
Basophil #: 0 x10 3/mm (ref 0.0–0.1)
Basophil %: 0.2 %
EOS PCT: 0.1 %
Eosinophil #: 0 x10 3/mm (ref 0.0–0.7)
HCT: 30.8 % — ABNORMAL LOW (ref 40.0–52.0)
HGB: 9.6 g/dL — AB (ref 13.0–18.0)
Lymphocyte #: 0.7 x10 3/mm — ABNORMAL LOW (ref 1.0–3.6)
Lymphocyte %: 17.3 %
MCH: 28.1 pg (ref 26.0–34.0)
MCHC: 31.1 g/dL — AB (ref 32.0–36.0)
MCV: 91 fL (ref 80–100)
Monocyte #: 0.1 x10 3/mm — ABNORMAL LOW (ref 0.2–1.0)
Monocyte %: 1.6 %
NEUTROS PCT: 80.8 %
Neutrophil #: 3.1 x10 3/mm (ref 1.4–6.5)
Platelet: 178 x10 3/mm (ref 150–440)
RBC: 3.4 10*6/uL — AB (ref 4.40–5.90)
RDW: 20.9 % — ABNORMAL HIGH (ref 11.5–14.5)
WBC: 3.8 x10 3/mm (ref 3.8–10.6)

## 2014-01-31 LAB — BASIC METABOLIC PANEL
Anion Gap: 15 (ref 7–16)
BUN: 14 mg/dL (ref 7–18)
CO2: 22 mmol/L (ref 21–32)
CREATININE: 1.82 mg/dL — AB (ref 0.60–1.30)
Calcium, Total: 8 mg/dL — ABNORMAL LOW (ref 8.5–10.1)
Chloride: 105 mmol/L (ref 98–107)
EGFR (African American): 45 — ABNORMAL LOW
EGFR (Non-African Amer.): 39 — ABNORMAL LOW
Glucose: 176 mg/dL — ABNORMAL HIGH (ref 65–99)
OSMOLALITY: 288 (ref 275–301)
POTASSIUM: 3.6 mmol/L (ref 3.5–5.1)
SODIUM: 142 mmol/L (ref 136–145)

## 2014-02-05 ENCOUNTER — Ambulatory Visit: Payer: Self-pay | Admitting: Internal Medicine

## 2014-02-05 ENCOUNTER — Ambulatory Visit: Payer: Self-pay | Admitting: Gynecologic Oncology

## 2014-02-07 LAB — CBC CANCER CENTER
BASOS PCT: 0.8 %
Basophil #: 0 x10 3/mm (ref 0.0–0.1)
EOS PCT: 1.1 %
Eosinophil #: 0 x10 3/mm (ref 0.0–0.7)
HCT: 31.5 % — ABNORMAL LOW (ref 40.0–52.0)
HGB: 9.9 g/dL — ABNORMAL LOW (ref 13.0–18.0)
LYMPHS ABS: 1.1 x10 3/mm (ref 1.0–3.6)
LYMPHS PCT: 44.8 %
MCH: 28 pg (ref 26.0–34.0)
MCHC: 31.6 g/dL — AB (ref 32.0–36.0)
MCV: 89 fL (ref 80–100)
MONO ABS: 0.1 x10 3/mm — AB (ref 0.2–1.0)
MONOS PCT: 3.2 %
NEUTROS PCT: 50.1 %
Neutrophil #: 1.3 x10 3/mm — ABNORMAL LOW (ref 1.4–6.5)
Platelet: 110 x10 3/mm — ABNORMAL LOW (ref 150–440)
RBC: 3.55 10*6/uL — ABNORMAL LOW (ref 4.40–5.90)
RDW: 20 % — ABNORMAL HIGH (ref 11.5–14.5)
WBC: 2.5 x10 3/mm — ABNORMAL LOW (ref 3.8–10.6)

## 2014-02-14 LAB — CBC CANCER CENTER
Basophil #: 0 x10 3/mm (ref 0.0–0.1)
Basophil %: 0.3 %
Eosinophil #: 0 x10 3/mm (ref 0.0–0.7)
Eosinophil %: 0.8 %
HCT: 27.6 % — ABNORMAL LOW (ref 40.0–52.0)
HGB: 8.6 g/dL — AB (ref 13.0–18.0)
Lymphocyte #: 1.2 x10 3/mm (ref 1.0–3.6)
Lymphocyte %: 39.6 %
MCH: 27.4 pg (ref 26.0–34.0)
MCHC: 31.2 g/dL — ABNORMAL LOW (ref 32.0–36.0)
MCV: 88 fL (ref 80–100)
MONO ABS: 0.5 x10 3/mm (ref 0.2–1.0)
MONOS PCT: 18.2 %
NEUTROS ABS: 1.2 x10 3/mm — AB (ref 1.4–6.5)
Neutrophil %: 41.1 %
PLATELETS: 174 x10 3/mm (ref 150–440)
RBC: 3.14 10*6/uL — ABNORMAL LOW (ref 4.40–5.90)
RDW: 19.9 % — ABNORMAL HIGH (ref 11.5–14.5)
WBC: 3 x10 3/mm — ABNORMAL LOW (ref 3.8–10.6)

## 2014-02-14 LAB — HEPATIC FUNCTION PANEL A (ARMC)
ALK PHOS: 112 U/L
ALT: 19 U/L (ref 12–78)
AST: 16 U/L (ref 15–37)
Albumin: 3 g/dL — ABNORMAL LOW (ref 3.4–5.0)
BILIRUBIN TOTAL: 0.3 mg/dL (ref 0.2–1.0)
Bilirubin, Direct: 0.1 mg/dL (ref 0.00–0.20)
Total Protein: 7.2 g/dL (ref 6.4–8.2)

## 2014-02-14 LAB — CREATININE, SERUM
Creatinine: 1.53 mg/dL — ABNORMAL HIGH (ref 0.60–1.30)
EGFR (African American): 56 — ABNORMAL LOW
EGFR (Non-African Amer.): 48 — ABNORMAL LOW

## 2014-02-21 LAB — CBC CANCER CENTER
Basophil #: 0 x10 3/mm (ref 0.0–0.1)
Basophil %: 0.4 %
Eosinophil #: 0 x10 3/mm (ref 0.0–0.7)
Eosinophil %: 0 %
HCT: 31.1 % — ABNORMAL LOW (ref 40.0–52.0)
HGB: 9.7 g/dL — ABNORMAL LOW (ref 13.0–18.0)
LYMPHS ABS: 0.6 x10 3/mm — AB (ref 1.0–3.6)
LYMPHS PCT: 11 %
MCH: 27.7 pg (ref 26.0–34.0)
MCHC: 31.2 g/dL — ABNORMAL LOW (ref 32.0–36.0)
MCV: 89 fL (ref 80–100)
MONO ABS: 0 x10 3/mm — AB (ref 0.2–1.0)
Monocyte %: 0.8 %
NEUTROS ABS: 4.8 x10 3/mm (ref 1.4–6.5)
NEUTROS PCT: 87.8 %
Platelet: 221 x10 3/mm (ref 150–440)
RBC: 3.51 10*6/uL — AB (ref 4.40–5.90)
RDW: 20.5 % — ABNORMAL HIGH (ref 11.5–14.5)
WBC: 5.4 x10 3/mm (ref 3.8–10.6)

## 2014-02-21 LAB — BASIC METABOLIC PANEL
ANION GAP: 14 (ref 7–16)
BUN: 17 mg/dL (ref 7–18)
CALCIUM: 9.5 mg/dL (ref 8.5–10.1)
CO2: 23 mmol/L (ref 21–32)
Chloride: 103 mmol/L (ref 98–107)
Creatinine: 1.65 mg/dL — ABNORMAL HIGH (ref 0.60–1.30)
EGFR (African American): 51 — ABNORMAL LOW
EGFR (Non-African Amer.): 44 — ABNORMAL LOW
Glucose: 157 mg/dL — ABNORMAL HIGH (ref 65–99)
Osmolality: 284 (ref 275–301)
POTASSIUM: 4.3 mmol/L (ref 3.5–5.1)
SODIUM: 140 mmol/L (ref 136–145)

## 2014-02-27 ENCOUNTER — Inpatient Hospital Stay: Payer: Self-pay | Admitting: Internal Medicine

## 2014-02-27 LAB — URINALYSIS, COMPLETE
Bilirubin,UR: NEGATIVE
GLUCOSE, UR: NEGATIVE mg/dL (ref 0–75)
KETONE: NEGATIVE
Nitrite: NEGATIVE
Ph: 7 (ref 4.5–8.0)
RBC,UR: 411 /HPF (ref 0–5)
Specific Gravity: 1.013 (ref 1.003–1.030)
Squamous Epithelial: 1
WBC UR: 254 /HPF (ref 0–5)

## 2014-02-27 LAB — PROTIME-INR
INR: 5.5
PROTHROMBIN TIME: 48.4 s — AB (ref 11.5–14.7)

## 2014-02-27 LAB — CBC
HCT: 30.3 % — ABNORMAL LOW (ref 40.0–52.0)
HGB: 9.5 g/dL — ABNORMAL LOW (ref 13.0–18.0)
MCH: 27.8 pg (ref 26.0–34.0)
MCHC: 31.3 g/dL — AB (ref 32.0–36.0)
MCV: 89 fL (ref 80–100)
PLATELETS: 135 10*3/uL — AB (ref 150–440)
RBC: 3.41 10*6/uL — AB (ref 4.40–5.90)
RDW: 20.2 % — AB (ref 11.5–14.5)
WBC: 4.5 10*3/uL (ref 3.8–10.6)

## 2014-02-27 LAB — COMPREHENSIVE METABOLIC PANEL
ANION GAP: 6 — AB (ref 7–16)
AST: 24 U/L (ref 15–37)
Albumin: 3.6 g/dL (ref 3.4–5.0)
Alkaline Phosphatase: 147 U/L — ABNORMAL HIGH
BILIRUBIN TOTAL: 0.5 mg/dL (ref 0.2–1.0)
BUN: 25 mg/dL — ABNORMAL HIGH (ref 7–18)
CALCIUM: 9.4 mg/dL (ref 8.5–10.1)
CHLORIDE: 103 mmol/L (ref 98–107)
Co2: 27 mmol/L (ref 21–32)
Creatinine: 1.46 mg/dL — ABNORMAL HIGH (ref 0.60–1.30)
EGFR (African American): 59 — ABNORMAL LOW
EGFR (Non-African Amer.): 51 — ABNORMAL LOW
Glucose: 100 mg/dL — ABNORMAL HIGH (ref 65–99)
OSMOLALITY: 276 (ref 275–301)
POTASSIUM: 3.8 mmol/L (ref 3.5–5.1)
SGPT (ALT): 36 U/L (ref 12–78)
SODIUM: 136 mmol/L (ref 136–145)
TOTAL PROTEIN: 7.7 g/dL (ref 6.4–8.2)

## 2014-02-27 LAB — HEMOGLOBIN: HGB: 8 g/dL — ABNORMAL LOW (ref 13.0–18.0)

## 2014-02-27 LAB — LIPASE, BLOOD: Lipase: 55 U/L — ABNORMAL LOW (ref 73–393)

## 2014-02-27 LAB — TROPONIN I

## 2014-02-28 LAB — BASIC METABOLIC PANEL
Anion Gap: 6 — ABNORMAL LOW (ref 7–16)
BUN: 23 mg/dL — ABNORMAL HIGH (ref 7–18)
CALCIUM: 8.3 mg/dL — AB (ref 8.5–10.1)
Chloride: 110 mmol/L — ABNORMAL HIGH (ref 98–107)
Co2: 24 mmol/L (ref 21–32)
Creatinine: 1.34 mg/dL — ABNORMAL HIGH (ref 0.60–1.30)
EGFR (African American): >60
EGFR (Non-African Amer.): 57 — ABNORMAL LOW
GLUCOSE: 87 mg/dL (ref 65–99)
OSMOLALITY: 282 (ref 275–301)
Potassium: 3.9 mmol/L (ref 3.5–5.1)
SODIUM: 140 mmol/L (ref 136–145)

## 2014-02-28 LAB — CBC WITH DIFFERENTIAL/PLATELET
BASOS PCT: 0.6 %
Basophil #: 0 10*3/uL (ref 0.0–0.1)
Eosinophil #: 0 10*3/uL (ref 0.0–0.7)
Eosinophil %: 0.3 %
HCT: 24.3 % — ABNORMAL LOW (ref 40.0–52.0)
HGB: 7.9 g/dL — ABNORMAL LOW (ref 13.0–18.0)
LYMPHS ABS: 1 10*3/uL (ref 1.0–3.6)
LYMPHS PCT: 41.8 %
MCH: 28.7 pg (ref 26.0–34.0)
MCHC: 32.5 g/dL (ref 32.0–36.0)
MCV: 88 fL (ref 80–100)
Monocyte #: 0.1 x10 3/mm — ABNORMAL LOW (ref 0.2–1.0)
Monocyte %: 3.2 %
NEUTROS PCT: 54.1 %
Neutrophil #: 1.2 10*3/uL — ABNORMAL LOW (ref 1.4–6.5)
Platelet: 91 10*3/uL — ABNORMAL LOW (ref 150–440)
RBC: 2.75 10*6/uL — ABNORMAL LOW (ref 4.40–5.90)
RDW: 20.1 % — ABNORMAL HIGH (ref 11.5–14.5)
WBC: 2.3 10*3/uL — AB (ref 3.8–10.6)

## 2014-02-28 LAB — PROTIME-INR
INR: 5.5 — AB
PROTHROMBIN TIME: 48.1 s — AB (ref 11.5–14.7)

## 2014-03-01 LAB — HEMOGLOBIN
HGB: 7.1 g/dL — ABNORMAL LOW (ref 13.0–18.0)
HGB: 8.3 g/dL — AB (ref 13.0–18.0)

## 2014-03-01 LAB — PROTIME-INR
INR: 3
PROTHROMBIN TIME: 30.3 s — AB (ref 11.5–14.7)

## 2014-03-01 LAB — URINE CULTURE

## 2014-03-05 ENCOUNTER — Emergency Department: Payer: Self-pay | Admitting: Emergency Medicine

## 2014-03-05 LAB — COMPREHENSIVE METABOLIC PANEL
ALBUMIN: 3.1 g/dL — AB (ref 3.4–5.0)
Alkaline Phosphatase: 118 U/L — ABNORMAL HIGH
Anion Gap: 7 (ref 7–16)
BILIRUBIN TOTAL: 0.5 mg/dL (ref 0.2–1.0)
BUN: 17 mg/dL (ref 7–18)
Calcium, Total: 9 mg/dL (ref 8.5–10.1)
Chloride: 102 mmol/L (ref 98–107)
Co2: 27 mmol/L (ref 21–32)
Creatinine: 1.55 mg/dL — ABNORMAL HIGH (ref 0.60–1.30)
EGFR (African American): 55 — ABNORMAL LOW
EGFR (Non-African Amer.): 48 — ABNORMAL LOW
GLUCOSE: 104 mg/dL — AB (ref 65–99)
Osmolality: 274 (ref 275–301)
POTASSIUM: 3.7 mmol/L (ref 3.5–5.1)
SGOT(AST): 14 U/L — ABNORMAL LOW (ref 15–37)
SGPT (ALT): 24 U/L (ref 12–78)
SODIUM: 136 mmol/L (ref 136–145)
Total Protein: 7.2 g/dL (ref 6.4–8.2)

## 2014-03-05 LAB — CBC WITH DIFFERENTIAL/PLATELET
Basophil #: 0 10*3/uL (ref 0.0–0.1)
Basophil %: 0.2 %
EOS PCT: 2.3 %
Eosinophil #: 0 10*3/uL (ref 0.0–0.7)
HCT: 27.9 % — ABNORMAL LOW (ref 40.0–52.0)
HGB: 9.2 g/dL — ABNORMAL LOW (ref 13.0–18.0)
Lymphocyte #: 0.8 10*3/uL — ABNORMAL LOW (ref 1.0–3.6)
Lymphocyte %: 43.2 %
MCH: 28.6 pg (ref 26.0–34.0)
MCHC: 33.1 g/dL (ref 32.0–36.0)
MCV: 86 fL (ref 80–100)
Monocyte #: 0.7 x10 3/mm (ref 0.2–1.0)
Monocyte %: 38.6 %
Neutrophil #: 0.3 10*3/uL — ABNORMAL LOW (ref 1.4–6.5)
Neutrophil %: 15.7 %
PLATELETS: 106 10*3/uL — AB (ref 150–440)
RBC: 3.22 10*6/uL — ABNORMAL LOW (ref 4.40–5.90)
RDW: 18.7 % — ABNORMAL HIGH (ref 11.5–14.5)
WBC: 1.7 10*3/uL — AB (ref 3.8–10.6)

## 2014-03-05 LAB — LIPASE, BLOOD: Lipase: 49 U/L — ABNORMAL LOW (ref 73–393)

## 2014-03-05 LAB — TROPONIN I: Troponin-I: <0.02 ng/mL

## 2014-03-07 LAB — CBC CANCER CENTER
BASOS ABS: 0 x10 3/mm (ref 0.0–0.1)
BASOS PCT: 0.4 %
EOS ABS: 0 x10 3/mm (ref 0.0–0.7)
Eosinophil %: 0.8 %
HCT: 29.9 % — ABNORMAL LOW (ref 40.0–52.0)
HGB: 9.3 g/dL — AB (ref 13.0–18.0)
Lymphocyte #: 1.1 x10 3/mm (ref 1.0–3.6)
Lymphocyte %: 37.7 %
MCH: 27.5 pg (ref 26.0–34.0)
MCHC: 31.2 g/dL — AB (ref 32.0–36.0)
MCV: 88 fL (ref 80–100)
Monocyte #: 0.6 x10 3/mm (ref 0.2–1.0)
Monocyte %: 21.2 %
NEUTROS PCT: 39.9 %
Neutrophil #: 1.2 x10 3/mm — ABNORMAL LOW (ref 1.4–6.5)
PLATELETS: 143 x10 3/mm — AB (ref 150–440)
RBC: 3.4 10*6/uL — AB (ref 4.40–5.90)
RDW: 19.4 % — AB (ref 11.5–14.5)
WBC: 3 x10 3/mm — ABNORMAL LOW (ref 3.8–10.6)

## 2014-03-07 LAB — HEPATIC FUNCTION PANEL A (ARMC)
Albumin: 3.3 g/dL — ABNORMAL LOW (ref 3.4–5.0)
Alkaline Phosphatase: 127 U/L — ABNORMAL HIGH
BILIRUBIN DIRECT: 0.1 mg/dL (ref 0.00–0.20)
Bilirubin,Total: 0.2 mg/dL (ref 0.2–1.0)
SGOT(AST): 22 U/L (ref 15–37)
SGPT (ALT): 28 U/L (ref 12–78)
Total Protein: 7.3 g/dL (ref 6.4–8.2)

## 2014-03-07 LAB — CREATININE, SERUM
CREATININE: 1.65 mg/dL — AB (ref 0.60–1.30)
EGFR (African American): 51 — ABNORMAL LOW
EGFR (Non-African Amer.): 44 — ABNORMAL LOW

## 2014-03-08 ENCOUNTER — Ambulatory Visit: Payer: Self-pay | Admitting: Gynecologic Oncology

## 2014-03-08 ENCOUNTER — Ambulatory Visit: Payer: Self-pay | Admitting: Internal Medicine

## 2014-03-14 LAB — CBC CANCER CENTER
BASOS ABS: 0 x10 3/mm (ref 0.0–0.1)
Basophil %: 0.4 %
EOS ABS: 0 x10 3/mm (ref 0.0–0.7)
Eosinophil %: 0.3 %
HCT: 30.2 % — ABNORMAL LOW (ref 40.0–52.0)
HGB: 9.4 g/dL — AB (ref 13.0–18.0)
Lymphocyte #: 1.6 x10 3/mm (ref 1.0–3.6)
Lymphocyte %: 26.9 %
MCH: 27.5 pg (ref 26.0–34.0)
MCHC: 31.3 g/dL — ABNORMAL LOW (ref 32.0–36.0)
MCV: 88 fL (ref 80–100)
MONO ABS: 0.5 x10 3/mm (ref 0.2–1.0)
MONOS PCT: 8.2 %
Neutrophil #: 3.7 x10 3/mm (ref 1.4–6.5)
Neutrophil %: 64.2 %
Platelet: 173 x10 3/mm (ref 150–440)
RBC: 3.43 10*6/uL — ABNORMAL LOW (ref 4.40–5.90)
RDW: 19.4 % — ABNORMAL HIGH (ref 11.5–14.5)
WBC: 5.8 x10 3/mm (ref 3.8–10.6)

## 2014-03-14 LAB — BASIC METABOLIC PANEL
ANION GAP: 10 (ref 7–16)
BUN: 12 mg/dL (ref 7–18)
Calcium, Total: 9.2 mg/dL (ref 8.5–10.1)
Chloride: 104 mmol/L (ref 98–107)
Co2: 29 mmol/L (ref 21–32)
Creatinine: 1.59 mg/dL — ABNORMAL HIGH (ref 0.60–1.30)
EGFR (African American): 54 — ABNORMAL LOW
EGFR (Non-African Amer.): 46 — ABNORMAL LOW
Glucose: 106 mg/dL — ABNORMAL HIGH (ref 65–99)
OSMOLALITY: 285 (ref 275–301)
POTASSIUM: 3.8 mmol/L (ref 3.5–5.1)
SODIUM: 143 mmol/L (ref 136–145)

## 2014-03-18 ENCOUNTER — Emergency Department: Payer: Self-pay | Admitting: Emergency Medicine

## 2014-03-18 LAB — CBC
HCT: 33.1 % — ABNORMAL LOW (ref 40.0–52.0)
HGB: 10.6 g/dL — ABNORMAL LOW (ref 13.0–18.0)
MCH: 28 pg (ref 26.0–34.0)
MCHC: 31.9 g/dL — ABNORMAL LOW (ref 32.0–36.0)
MCV: 88 fL (ref 80–100)
PLATELETS: 132 10*3/uL — AB (ref 150–440)
RBC: 3.78 10*6/uL — ABNORMAL LOW (ref 4.40–5.90)
RDW: 19.7 % — AB (ref 11.5–14.5)
WBC: 3.7 10*3/uL — AB (ref 3.8–10.6)

## 2014-03-18 LAB — URINALYSIS, COMPLETE
Bilirubin,UR: NEGATIVE
GLUCOSE, UR: NEGATIVE mg/dL (ref 0–75)
Ketone: NEGATIVE
NITRITE: NEGATIVE
Ph: 9 (ref 4.5–8.0)
Protein: 100
SPECIFIC GRAVITY: 1.013 (ref 1.003–1.030)
Squamous Epithelial: NONE SEEN
WBC UR: 23 /HPF (ref 0–5)

## 2014-03-18 LAB — COMPREHENSIVE METABOLIC PANEL
ALT: 45 U/L (ref 12–78)
ANION GAP: 6 — AB (ref 7–16)
Albumin: 3.8 g/dL (ref 3.4–5.0)
Alkaline Phosphatase: 145 U/L — ABNORMAL HIGH
BUN: 19 mg/dL — ABNORMAL HIGH (ref 7–18)
Bilirubin,Total: 0.6 mg/dL (ref 0.2–1.0)
Calcium, Total: 8.8 mg/dL (ref 8.5–10.1)
Chloride: 103 mmol/L (ref 98–107)
Co2: 28 mmol/L (ref 21–32)
Creatinine: 1.27 mg/dL (ref 0.60–1.30)
EGFR (Non-African Amer.): >60
GLUCOSE: 99 mg/dL (ref 65–99)
Osmolality: 276 (ref 275–301)
POTASSIUM: 3.6 mmol/L (ref 3.5–5.1)
SGOT(AST): 41 U/L — ABNORMAL HIGH (ref 15–37)
SODIUM: 137 mmol/L (ref 136–145)
Total Protein: 8.1 g/dL (ref 6.4–8.2)

## 2014-03-18 LAB — PROTIME-INR
INR: 3.9
PROTHROMBIN TIME: 36.7 s — AB (ref 11.5–14.7)

## 2014-03-20 ENCOUNTER — Emergency Department: Payer: Self-pay | Admitting: Emergency Medicine

## 2014-03-20 LAB — COMPREHENSIVE METABOLIC PANEL
ALK PHOS: 137 U/L — AB
AST: 29 U/L (ref 15–37)
Albumin: 3.7 g/dL (ref 3.4–5.0)
Anion Gap: 7 (ref 7–16)
BILIRUBIN TOTAL: 0.5 mg/dL (ref 0.2–1.0)
BUN: 19 mg/dL — AB (ref 7–18)
CALCIUM: 9.1 mg/dL (ref 8.5–10.1)
CHLORIDE: 104 mmol/L (ref 98–107)
CO2: 26 mmol/L (ref 21–32)
Creatinine: 1.62 mg/dL — ABNORMAL HIGH (ref 0.60–1.30)
EGFR (African American): 52 — ABNORMAL LOW
EGFR (Non-African Amer.): 45 — ABNORMAL LOW
GLUCOSE: 109 mg/dL — AB (ref 65–99)
Osmolality: 277 (ref 275–301)
Potassium: 3.5 mmol/L (ref 3.5–5.1)
SGPT (ALT): 40 U/L (ref 12–78)
SODIUM: 137 mmol/L (ref 136–145)
TOTAL PROTEIN: 8 g/dL (ref 6.4–8.2)

## 2014-03-20 LAB — CBC WITH DIFFERENTIAL/PLATELET
BASOS ABS: 0 10*3/uL (ref 0.0–0.1)
Basophil %: 0.9 %
EOS PCT: 0.6 %
Eosinophil #: 0 10*3/uL (ref 0.0–0.7)
HCT: 33.1 % — ABNORMAL LOW (ref 40.0–52.0)
HGB: 10.6 g/dL — AB (ref 13.0–18.0)
LYMPHS ABS: 0.9 10*3/uL — AB (ref 1.0–3.6)
LYMPHS PCT: 38.1 %
MCH: 28.2 pg (ref 26.0–34.0)
MCHC: 32.1 g/dL (ref 32.0–36.0)
MCV: 88 fL (ref 80–100)
Monocyte #: 0 x10 3/mm — ABNORMAL LOW (ref 0.2–1.0)
Monocyte %: 2 %
Neutrophil #: 1.4 10*3/uL (ref 1.4–6.5)
Neutrophil %: 58.4 %
PLATELETS: 112 10*3/uL — AB (ref 150–440)
RBC: 3.76 10*6/uL — ABNORMAL LOW (ref 4.40–5.90)
RDW: 19.5 % — AB (ref 11.5–14.5)
WBC: 2.3 10*3/uL — AB (ref 3.8–10.6)

## 2014-03-20 LAB — URINALYSIS, COMPLETE
Bacteria: NONE SEEN
Bilirubin,UR: NEGATIVE
Glucose,UR: NEGATIVE mg/dL (ref 0–75)
Ketone: NEGATIVE
NITRITE: NEGATIVE
PH: 7 (ref 4.5–8.0)
Protein: 100
RBC,UR: 497 /HPF (ref 0–5)
Specific Gravity: 1.013 (ref 1.003–1.030)
Squamous Epithelial: NONE SEEN
WBC UR: 589 /HPF (ref 0–5)

## 2014-03-21 LAB — URINE CULTURE

## 2014-03-28 LAB — CBC CANCER CENTER
Basophil #: 0 x10 3/mm (ref 0.0–0.1)
Basophil %: 0.6 %
Eosinophil #: 0 x10 3/mm (ref 0.0–0.7)
Eosinophil %: 1.9 %
HCT: 30.4 % — ABNORMAL LOW (ref 40.0–52.0)
HGB: 9.5 g/dL — ABNORMAL LOW (ref 13.0–18.0)
Lymphocyte #: 1.2 x10 3/mm (ref 1.0–3.6)
Lymphocyte %: 61.6 %
MCH: 27.9 pg (ref 26.0–34.0)
MCHC: 31.3 g/dL — ABNORMAL LOW (ref 32.0–36.0)
MCV: 89 fL (ref 80–100)
Monocyte #: 0.4 x10 3/mm (ref 0.2–1.0)
Monocyte %: 23.4 %
NEUTROS ABS: 0.2 x10 3/mm — AB (ref 1.4–6.5)
Neutrophil %: 12.5 %
Platelet: 115 x10 3/mm — ABNORMAL LOW (ref 150–440)
RBC: 3.41 10*6/uL — AB (ref 4.40–5.90)
RDW: 20.7 % — ABNORMAL HIGH (ref 11.5–14.5)
WBC: 1.9 x10 3/mm — CL (ref 3.8–10.6)

## 2014-03-28 LAB — HEPATIC FUNCTION PANEL A (ARMC)
ALK PHOS: 143 U/L — AB
AST: 18 U/L (ref 15–37)
Albumin: 3.6 g/dL (ref 3.4–5.0)
BILIRUBIN TOTAL: 0.3 mg/dL (ref 0.2–1.0)
Bilirubin, Direct: 0.1 mg/dL (ref 0.00–0.20)
SGPT (ALT): 25 U/L (ref 12–78)
TOTAL PROTEIN: 7.4 g/dL (ref 6.4–8.2)

## 2014-03-28 LAB — CREATININE, SERUM
CREATININE: 1.59 mg/dL — AB (ref 0.60–1.30)
EGFR (African American): 54 — ABNORMAL LOW
EGFR (Non-African Amer.): 46 — ABNORMAL LOW

## 2014-04-04 LAB — CBC CANCER CENTER
BASOS ABS: 0 x10 3/mm (ref 0.0–0.1)
BASOS PCT: 0.6 %
Eosinophil #: 0 x10 3/mm (ref 0.0–0.7)
Eosinophil %: 1 %
HCT: 29.8 % — AB (ref 40.0–52.0)
HGB: 9.6 g/dL — AB (ref 13.0–18.0)
Lymphocyte #: 1.3 x10 3/mm (ref 1.0–3.6)
Lymphocyte %: 30 %
MCH: 28.4 pg (ref 26.0–34.0)
MCHC: 32 g/dL (ref 32.0–36.0)
MCV: 89 fL (ref 80–100)
MONOS PCT: 9.2 %
Monocyte #: 0.4 x10 3/mm (ref 0.2–1.0)
Neutrophil #: 2.5 x10 3/mm (ref 1.4–6.5)
Neutrophil %: 59.2 %
Platelet: 119 x10 3/mm — ABNORMAL LOW (ref 150–440)
RBC: 3.37 10*6/uL — AB (ref 4.40–5.90)
RDW: 21.2 % — AB (ref 11.5–14.5)
WBC: 4.3 x10 3/mm (ref 3.8–10.6)

## 2014-04-04 LAB — BASIC METABOLIC PANEL
Anion Gap: 12 (ref 7–16)
BUN: 8 mg/dL (ref 7–18)
CALCIUM: 8.8 mg/dL (ref 8.5–10.1)
Chloride: 106 mmol/L (ref 98–107)
Co2: 25 mmol/L (ref 21–32)
Creatinine: 1.59 mg/dL — ABNORMAL HIGH (ref 0.60–1.30)
EGFR (African American): 54 — ABNORMAL LOW
EGFR (Non-African Amer.): 46 — ABNORMAL LOW
GLUCOSE: 126 mg/dL — AB (ref 65–99)
OSMOLALITY: 285 (ref 275–301)
Potassium: 3.2 mmol/L — ABNORMAL LOW (ref 3.5–5.1)
SODIUM: 143 mmol/L (ref 136–145)

## 2014-04-07 ENCOUNTER — Ambulatory Visit: Payer: Self-pay | Admitting: Internal Medicine

## 2014-04-10 ENCOUNTER — Emergency Department: Payer: Self-pay | Admitting: Emergency Medicine

## 2014-04-10 LAB — URINALYSIS, COMPLETE: SPECIFIC GRAVITY: 1.014 (ref 1.003–1.030)

## 2014-04-10 LAB — CBC
HCT: 31.4 % — AB (ref 40.0–52.0)
HGB: 10.3 g/dL — ABNORMAL LOW (ref 13.0–18.0)
MCH: 29.5 pg (ref 26.0–34.0)
MCHC: 32.7 g/dL (ref 32.0–36.0)
MCV: 90 fL (ref 80–100)
Platelet: 117 10*3/uL — ABNORMAL LOW (ref 150–440)
RBC: 3.48 10*6/uL — ABNORMAL LOW (ref 4.40–5.90)
RDW: 20.6 % — AB (ref 11.5–14.5)
WBC: 3.6 10*3/uL — ABNORMAL LOW (ref 3.8–10.6)

## 2014-04-10 LAB — COMPREHENSIVE METABOLIC PANEL
ALBUMIN: 4 g/dL (ref 3.4–5.0)
Alkaline Phosphatase: 140 U/L — ABNORMAL HIGH
Anion Gap: 6 — ABNORMAL LOW (ref 7–16)
BUN: 16 mg/dL (ref 7–18)
Bilirubin,Total: 0.5 mg/dL (ref 0.2–1.0)
Calcium, Total: 9.6 mg/dL (ref 8.5–10.1)
Chloride: 105 mmol/L (ref 98–107)
Co2: 26 mmol/L (ref 21–32)
Creatinine: 1.25 mg/dL (ref 0.60–1.30)
EGFR (African American): >60
EGFR (Non-African Amer.): >60
Glucose: 100 mg/dL — ABNORMAL HIGH (ref 65–99)
OSMOLALITY: 275 (ref 275–301)
POTASSIUM: 3.9 mmol/L (ref 3.5–5.1)
SGOT(AST): 24 U/L (ref 15–37)
SGPT (ALT): 33 U/L (ref 12–78)
Sodium: 137 mmol/L (ref 136–145)
Total Protein: 8.3 g/dL — ABNORMAL HIGH (ref 6.4–8.2)

## 2014-04-10 LAB — PROTIME-INR
INR: 2.6
Prothrombin Time: 27.2 secs — ABNORMAL HIGH (ref 11.5–14.7)

## 2014-04-11 ENCOUNTER — Ambulatory Visit: Payer: Self-pay | Admitting: Gynecologic Oncology

## 2014-04-11 LAB — POTASSIUM: Potassium: 3.6 mmol/L (ref 3.5–5.1)

## 2014-04-11 LAB — CBC CANCER CENTER
BASOS ABS: 0 x10 3/mm (ref 0.0–0.1)
Basophil %: 0.5 %
Eosinophil #: 0 x10 3/mm (ref 0.0–0.7)
Eosinophil %: 0.9 %
HCT: 28.6 % — ABNORMAL LOW (ref 40.0–52.0)
HGB: 9.2 g/dL — ABNORMAL LOW (ref 13.0–18.0)
Lymphocyte #: 1 x10 3/mm (ref 1.0–3.6)
Lymphocyte %: 30 %
MCH: 29.2 pg (ref 26.0–34.0)
MCHC: 32.2 g/dL (ref 32.0–36.0)
MCV: 91 fL (ref 80–100)
Monocyte #: 0.2 x10 3/mm (ref 0.2–1.0)
Monocyte %: 5.2 %
NEUTROS PCT: 63.4 %
Neutrophil #: 2.1 x10 3/mm (ref 1.4–6.5)
Platelet: 103 x10 3/mm — ABNORMAL LOW (ref 150–440)
RBC: 3.16 10*6/uL — AB (ref 4.40–5.90)
RDW: 20.3 % — ABNORMAL HIGH (ref 11.5–14.5)
WBC: 3.3 x10 3/mm — AB (ref 3.8–10.6)

## 2014-04-14 LAB — URINE CULTURE

## 2014-04-18 LAB — CBC CANCER CENTER
Basophil #: 0 x10 3/mm (ref 0.0–0.1)
Basophil %: 0.5 %
Eosinophil #: 0 x10 3/mm (ref 0.0–0.7)
Eosinophil %: 1 %
HCT: 29.4 % — AB (ref 40.0–52.0)
HGB: 9.5 g/dL — ABNORMAL LOW (ref 13.0–18.0)
Lymphocyte #: 0.9 x10 3/mm — ABNORMAL LOW (ref 1.0–3.6)
Lymphocyte %: 35.1 %
MCH: 29.5 pg (ref 26.0–34.0)
MCHC: 32.4 g/dL (ref 32.0–36.0)
MCV: 91 fL (ref 80–100)
MONO ABS: 0.2 x10 3/mm (ref 0.2–1.0)
Monocyte %: 7.6 %
NEUTROS ABS: 1.5 x10 3/mm (ref 1.4–6.5)
NEUTROS PCT: 55.8 %
PLATELETS: 142 x10 3/mm — AB (ref 150–440)
RBC: 3.22 10*6/uL — AB (ref 4.40–5.90)
RDW: 20.4 % — ABNORMAL HIGH (ref 11.5–14.5)
WBC: 2.6 x10 3/mm — ABNORMAL LOW (ref 3.8–10.6)

## 2014-04-27 ENCOUNTER — Emergency Department: Payer: Self-pay | Admitting: Emergency Medicine

## 2014-04-27 LAB — COMPREHENSIVE METABOLIC PANEL
Albumin: 3.6 g/dL (ref 3.4–5.0)
Alkaline Phosphatase: 152 U/L — ABNORMAL HIGH
Anion Gap: 7 (ref 7–16)
BILIRUBIN TOTAL: 0.2 mg/dL (ref 0.2–1.0)
BUN: 19 mg/dL — ABNORMAL HIGH (ref 7–18)
CHLORIDE: 115 mmol/L — AB (ref 98–107)
CO2: 20 mmol/L — AB (ref 21–32)
CREATININE: 1.56 mg/dL — AB (ref 0.60–1.30)
Calcium, Total: 8.5 mg/dL (ref 8.5–10.1)
EGFR (African American): 55 — ABNORMAL LOW
EGFR (Non-African Amer.): 47 — ABNORMAL LOW
Glucose: 118 mg/dL — ABNORMAL HIGH (ref 65–99)
Osmolality: 286 (ref 275–301)
Potassium: 4.2 mmol/L (ref 3.5–5.1)
SGOT(AST): 31 U/L (ref 15–37)
SGPT (ALT): 26 U/L (ref 12–78)
Sodium: 142 mmol/L (ref 136–145)
TOTAL PROTEIN: 6.4 g/dL (ref 6.4–8.2)

## 2014-04-27 LAB — CBC WITH DIFFERENTIAL/PLATELET
BASOS ABS: 0 10*3/uL (ref 0.0–0.1)
Basophil %: 0.5 %
EOS ABS: 0 10*3/uL (ref 0.0–0.7)
EOS PCT: 0.8 %
HCT: 31.5 % — AB (ref 40.0–52.0)
HGB: 10 g/dL — ABNORMAL LOW (ref 13.0–18.0)
LYMPHS ABS: 1.4 10*3/uL (ref 1.0–3.6)
Lymphocyte %: 26.9 %
MCH: 30.6 pg (ref 26.0–34.0)
MCHC: 31.9 g/dL — ABNORMAL LOW (ref 32.0–36.0)
MCV: 96 fL (ref 80–100)
MONOS PCT: 14.4 %
Monocyte #: 0.8 x10 3/mm (ref 0.2–1.0)
Neutrophil #: 3 10*3/uL (ref 1.4–6.5)
Neutrophil %: 57.4 %
Platelet: 163 10*3/uL (ref 150–440)
RBC: 3.28 10*6/uL — AB (ref 4.40–5.90)
RDW: 21.3 % — AB (ref 11.5–14.5)
WBC: 5.2 10*3/uL (ref 3.8–10.6)

## 2014-04-27 LAB — URINALYSIS, COMPLETE
BILIRUBIN, UR: NEGATIVE
GLUCOSE, UR: NEGATIVE mg/dL (ref 0–75)
Ketone: NEGATIVE
Nitrite: NEGATIVE
Ph: 6 (ref 4.5–8.0)
Protein: 100
SQUAMOUS EPITHELIAL: NONE SEEN
Specific Gravity: 1.013 (ref 1.003–1.030)
WBC UR: 131 /HPF (ref 0–5)

## 2014-05-02 LAB — CBC CANCER CENTER
Basophil #: 0 x10 3/mm (ref 0.0–0.1)
Basophil %: 0.6 %
EOS ABS: 0.1 x10 3/mm (ref 0.0–0.7)
Eosinophil %: 1 %
HCT: 33 % — AB (ref 40.0–52.0)
HGB: 10.5 g/dL — AB (ref 13.0–18.0)
Lymphocyte #: 1.8 x10 3/mm (ref 1.0–3.6)
Lymphocyte %: 28.8 %
MCH: 29.9 pg (ref 26.0–34.0)
MCHC: 31.7 g/dL — ABNORMAL LOW (ref 32.0–36.0)
MCV: 94 fL (ref 80–100)
MONO ABS: 0.6 x10 3/mm (ref 0.2–1.0)
MONOS PCT: 9.4 %
NEUTROS PCT: 60.2 %
Neutrophil #: 3.7 x10 3/mm (ref 1.4–6.5)
PLATELETS: 177 x10 3/mm (ref 150–440)
RBC: 3.5 10*6/uL — ABNORMAL LOW (ref 4.40–5.90)
RDW: 20.5 % — ABNORMAL HIGH (ref 11.5–14.5)
WBC: 6.2 x10 3/mm (ref 3.8–10.6)

## 2014-05-02 LAB — HEPATIC FUNCTION PANEL A (ARMC)
Albumin: 3.5 g/dL (ref 3.4–5.0)
Alkaline Phosphatase: 153 U/L — ABNORMAL HIGH
Bilirubin, Direct: 0.1 mg/dL (ref 0.00–0.20)
Bilirubin,Total: 0.2 mg/dL (ref 0.2–1.0)
SGOT(AST): 17 U/L (ref 15–37)
SGPT (ALT): 26 U/L (ref 12–78)
Total Protein: 7 g/dL (ref 6.4–8.2)

## 2014-05-02 LAB — BASIC METABOLIC PANEL
Anion Gap: 11 (ref 7–16)
BUN: 19 mg/dL — ABNORMAL HIGH (ref 7–18)
CALCIUM: 8.7 mg/dL (ref 8.5–10.1)
Chloride: 107 mmol/L (ref 98–107)
Co2: 26 mmol/L (ref 21–32)
Creatinine: 1.53 mg/dL — ABNORMAL HIGH (ref 0.60–1.30)
GFR CALC AF AMER: 56 — AB
GFR CALC NON AF AMER: 48 — AB
Glucose: 128 mg/dL — ABNORMAL HIGH (ref 65–99)
OSMOLALITY: 291 (ref 275–301)
Potassium: 3.8 mmol/L (ref 3.5–5.1)
Sodium: 144 mmol/L (ref 136–145)

## 2014-05-08 ENCOUNTER — Ambulatory Visit: Payer: Self-pay | Admitting: Internal Medicine

## 2014-05-08 ENCOUNTER — Ambulatory Visit: Payer: Self-pay | Admitting: Gynecologic Oncology

## 2014-05-23 ENCOUNTER — Emergency Department: Payer: Self-pay | Admitting: Internal Medicine

## 2014-05-23 LAB — URINALYSIS, COMPLETE
BILIRUBIN, UR: NEGATIVE
Glucose,UR: NEGATIVE mg/dL (ref 0–75)
Ketone: NEGATIVE
NITRITE: NEGATIVE
Ph: 6 (ref 4.5–8.0)
SPECIFIC GRAVITY: 1.015 (ref 1.003–1.030)
SQUAMOUS EPITHELIAL: NONE SEEN
Transitional Epi: 1
WBC UR: 334 /HPF (ref 0–5)

## 2014-05-23 LAB — COMPREHENSIVE METABOLIC PANEL
ALK PHOS: 135 U/L — AB
ANION GAP: 6 — AB (ref 7–16)
Albumin: 4 g/dL (ref 3.4–5.0)
BUN: 14 mg/dL (ref 7–18)
Bilirubin,Total: 0.5 mg/dL (ref 0.2–1.0)
CALCIUM: 9.2 mg/dL (ref 8.5–10.1)
CHLORIDE: 109 mmol/L — AB (ref 98–107)
CO2: 25 mmol/L (ref 21–32)
Creatinine: 1.73 mg/dL — ABNORMAL HIGH (ref 0.60–1.30)
EGFR (African American): 48 — ABNORMAL LOW
EGFR (Non-African Amer.): 41 — ABNORMAL LOW
Glucose: 93 mg/dL (ref 65–99)
OSMOLALITY: 280 (ref 275–301)
POTASSIUM: 3.7 mmol/L (ref 3.5–5.1)
SGOT(AST): 32 U/L (ref 15–37)
SGPT (ALT): 30 U/L (ref 12–78)
SODIUM: 140 mmol/L (ref 136–145)
Total Protein: 7.7 g/dL (ref 6.4–8.2)

## 2014-05-23 LAB — CBC
HCT: 33.6 % — AB (ref 40.0–52.0)
HGB: 10.8 g/dL — AB (ref 13.0–18.0)
MCH: 29.8 pg (ref 26.0–34.0)
MCHC: 32.1 g/dL (ref 32.0–36.0)
MCV: 93 fL (ref 80–100)
PLATELETS: 202 10*3/uL (ref 150–440)
RBC: 3.62 10*6/uL — ABNORMAL LOW (ref 4.40–5.90)
RDW: 19.5 % — ABNORMAL HIGH (ref 11.5–14.5)
WBC: 6.2 10*3/uL (ref 3.8–10.6)

## 2014-05-23 LAB — PROTIME-INR
INR: 1.9
Prothrombin Time: 21.3 secs — ABNORMAL HIGH (ref 11.5–14.7)

## 2014-06-07 ENCOUNTER — Ambulatory Visit: Payer: Self-pay | Admitting: Internal Medicine

## 2014-07-08 ENCOUNTER — Ambulatory Visit: Payer: Self-pay | Admitting: Internal Medicine

## 2014-07-11 LAB — URINALYSIS, COMPLETE
Bacteria: NONE SEEN
Bilirubin,UR: NEGATIVE
Glucose,UR: NEGATIVE mg/dL (ref 0–75)
KETONE: NEGATIVE
NITRITE: NEGATIVE
Ph: 9 (ref 4.5–8.0)
Protein: >=500
RBC,UR: 335 /HPF (ref 0–5)
SPECIFIC GRAVITY: 1.013 (ref 1.003–1.030)

## 2014-07-11 LAB — CBC CANCER CENTER
Basophil #: 0 x10 3/mm (ref 0.0–0.1)
Basophil %: 0.4 %
Eosinophil #: 0.1 x10 3/mm (ref 0.0–0.7)
Eosinophil %: 2.2 %
HCT: 38.4 % — AB (ref 40.0–52.0)
HGB: 12.1 g/dL — ABNORMAL LOW (ref 13.0–18.0)
Lymphocyte #: 1.3 x10 3/mm (ref 1.0–3.6)
Lymphocyte %: 24.9 %
MCH: 28.5 pg (ref 26.0–34.0)
MCHC: 31.5 g/dL — AB (ref 32.0–36.0)
MCV: 91 fL (ref 80–100)
MONOS PCT: 8.7 %
Monocyte #: 0.5 x10 3/mm (ref 0.2–1.0)
Neutrophil #: 3.3 x10 3/mm (ref 1.4–6.5)
Neutrophil %: 63.8 %
Platelet: 153 x10 3/mm (ref 150–440)
RBC: 4.25 10*6/uL — ABNORMAL LOW (ref 4.40–5.90)
RDW: 16.2 % — ABNORMAL HIGH (ref 11.5–14.5)
WBC: 5.2 x10 3/mm (ref 3.8–10.6)

## 2014-07-11 LAB — HEPATIC FUNCTION PANEL A (ARMC)
Albumin: 3.7 g/dL (ref 3.4–5.0)
Alkaline Phosphatase: 121 U/L — ABNORMAL HIGH
BILIRUBIN TOTAL: 0.2 mg/dL (ref 0.2–1.0)
SGOT(AST): 27 U/L (ref 15–37)
SGPT (ALT): 30 U/L
Total Protein: 7.5 g/dL (ref 6.4–8.2)

## 2014-07-11 LAB — CREATININE, SERUM
Creatinine: 1.66 mg/dL — ABNORMAL HIGH (ref 0.60–1.30)
EGFR (African American): 50 — ABNORMAL LOW
GFR CALC NON AF AMER: 44 — AB

## 2014-07-16 LAB — URINE CULTURE

## 2014-08-03 ENCOUNTER — Emergency Department: Payer: Self-pay | Admitting: Emergency Medicine

## 2014-08-03 LAB — COMPREHENSIVE METABOLIC PANEL
ALBUMIN: 3.7 g/dL (ref 3.4–5.0)
ANION GAP: 9 (ref 7–16)
Alkaline Phosphatase: 128 U/L — ABNORMAL HIGH
BUN: 18 mg/dL (ref 7–18)
Bilirubin,Total: 0.3 mg/dL (ref 0.2–1.0)
CHLORIDE: 108 mmol/L — AB (ref 98–107)
CREATININE: 1.67 mg/dL — AB (ref 0.60–1.30)
Calcium, Total: 8.9 mg/dL (ref 8.5–10.1)
Co2: 23 mmol/L (ref 21–32)
EGFR (Non-African Amer.): 43 — ABNORMAL LOW
GFR CALC AF AMER: 50 — AB
GLUCOSE: 116 mg/dL — AB (ref 65–99)
Osmolality: 282 (ref 275–301)
Potassium: 3.8 mmol/L (ref 3.5–5.1)
SGOT(AST): 21 U/L (ref 15–37)
SGPT (ALT): 31 U/L
SODIUM: 140 mmol/L (ref 136–145)
Total Protein: 7.3 g/dL (ref 6.4–8.2)

## 2014-08-03 LAB — CBC WITH DIFFERENTIAL/PLATELET
Basophil #: 0.1 10*3/uL (ref 0.0–0.1)
Basophil %: 1.4 %
Eosinophil #: 0.2 10*3/uL (ref 0.0–0.7)
Eosinophil %: 2.4 %
HCT: 38.2 % — ABNORMAL LOW (ref 40.0–52.0)
HGB: 12 g/dL — ABNORMAL LOW (ref 13.0–18.0)
Lymphocyte #: 2.2 10*3/uL (ref 1.0–3.6)
Lymphocyte %: 28.8 %
MCH: 28.2 pg (ref 26.0–34.0)
MCHC: 31.5 g/dL — ABNORMAL LOW (ref 32.0–36.0)
MCV: 90 fL (ref 80–100)
Monocyte #: 0.6 x10 3/mm (ref 0.2–1.0)
Monocyte %: 8.1 %
Neutrophil #: 4.4 10*3/uL (ref 1.4–6.5)
Neutrophil %: 59.3 %
Platelet: 146 10*3/uL — ABNORMAL LOW (ref 150–440)
RBC: 4.27 10*6/uL — ABNORMAL LOW (ref 4.40–5.90)
RDW: 16.5 % — AB (ref 11.5–14.5)
WBC: 7.5 10*3/uL (ref 3.8–10.6)

## 2014-08-03 LAB — PROTIME-INR
INR: 1.1
Prothrombin Time: 13.6 secs (ref 11.5–14.7)

## 2014-08-03 LAB — URINALYSIS, COMPLETE
Bilirubin,UR: NEGATIVE
GLUCOSE, UR: NEGATIVE mg/dL (ref 0–75)
KETONE: NEGATIVE
NITRITE: POSITIVE
Ph: 8 (ref 4.5–8.0)
Protein: 100
RBC,UR: 1818 /HPF (ref 0–5)
Specific Gravity: 1.013 (ref 1.003–1.030)
Squamous Epithelial: NONE SEEN

## 2014-08-03 LAB — LIPASE, BLOOD: LIPASE: 134 U/L (ref 73–393)

## 2014-08-06 LAB — URINE CULTURE

## 2014-08-07 LAB — URINALYSIS, COMPLETE
Bilirubin,UR: NEGATIVE
Glucose,UR: NEGATIVE mg/dL (ref 0–75)
Ketone: NEGATIVE
NITRITE: NEGATIVE
Ph: 6 (ref 4.5–8.0)
Protein: 30
RBC,UR: 421 /HPF (ref 0–5)
Specific Gravity: 1.013 (ref 1.003–1.030)
Squamous Epithelial: 1
WBC UR: 126 /HPF (ref 0–5)

## 2014-08-08 ENCOUNTER — Ambulatory Visit: Payer: Self-pay | Admitting: Internal Medicine

## 2014-08-08 LAB — URINE CULTURE

## 2014-08-09 ENCOUNTER — Emergency Department: Payer: Self-pay | Admitting: Emergency Medicine

## 2014-08-09 LAB — CBC WITH DIFFERENTIAL/PLATELET
Basophil #: 0 10*3/uL (ref 0.0–0.1)
Basophil %: 0.6 %
Eosinophil #: 0.1 10*3/uL (ref 0.0–0.7)
Eosinophil %: 1.4 %
HCT: 38.5 % — AB (ref 40.0–52.0)
HGB: 12.4 g/dL — ABNORMAL LOW (ref 13.0–18.0)
LYMPHS ABS: 1.2 10*3/uL (ref 1.0–3.6)
Lymphocyte %: 16.6 %
MCH: 28.2 pg (ref 26.0–34.0)
MCHC: 32.1 g/dL (ref 32.0–36.0)
MCV: 88 fL (ref 80–100)
MONO ABS: 0.6 x10 3/mm (ref 0.2–1.0)
MONOS PCT: 7.8 %
NEUTROS ABS: 5.5 10*3/uL (ref 1.4–6.5)
Neutrophil %: 73.6 %
Platelet: 143 10*3/uL — ABNORMAL LOW (ref 150–440)
RBC: 4.39 10*6/uL — ABNORMAL LOW (ref 4.40–5.90)
RDW: 16.9 % — AB (ref 11.5–14.5)
WBC: 7.5 10*3/uL (ref 3.8–10.6)

## 2014-08-09 LAB — COMPREHENSIVE METABOLIC PANEL
ALK PHOS: 120 U/L — AB
ANION GAP: 7 (ref 7–16)
Albumin: 3.6 g/dL (ref 3.4–5.0)
BUN: 19 mg/dL — ABNORMAL HIGH (ref 7–18)
Bilirubin,Total: 0.3 mg/dL (ref 0.2–1.0)
CALCIUM: 9.2 mg/dL (ref 8.5–10.1)
CHLORIDE: 108 mmol/L — AB (ref 98–107)
Co2: 25 mmol/L (ref 21–32)
Creatinine: 2 mg/dL — ABNORMAL HIGH (ref 0.60–1.30)
GFR CALC AF AMER: 40 — AB
GFR CALC NON AF AMER: 35 — AB
GLUCOSE: 118 mg/dL — AB (ref 65–99)
OSMOLALITY: 283 (ref 275–301)
Potassium: 3.7 mmol/L (ref 3.5–5.1)
SGOT(AST): 23 U/L (ref 15–37)
SGPT (ALT): 29 U/L
Sodium: 140 mmol/L (ref 136–145)
Total Protein: 7 g/dL (ref 6.4–8.2)

## 2014-08-09 LAB — URINALYSIS, COMPLETE
BILIRUBIN, UR: NEGATIVE
Bacteria: NONE SEEN
Glucose,UR: NEGATIVE mg/dL (ref 0–75)
KETONE: NEGATIVE
NITRITE: NEGATIVE
PH: 5 (ref 4.5–8.0)
Specific Gravity: 1.014 (ref 1.003–1.030)
Squamous Epithelial: NONE SEEN
WBC UR: 54 /HPF (ref 0–5)

## 2014-08-09 LAB — PROTIME-INR
INR: 1.2
Prothrombin Time: 15.2 secs — ABNORMAL HIGH (ref 11.5–14.7)

## 2014-08-10 ENCOUNTER — Ambulatory Visit: Payer: Self-pay | Admitting: Internal Medicine

## 2014-08-26 ENCOUNTER — Emergency Department: Payer: Self-pay

## 2014-08-26 LAB — CBC WITH DIFFERENTIAL/PLATELET
BASOS PCT: 0.4 %
Basophil #: 0 10*3/uL (ref 0.0–0.1)
Eosinophil #: 0.2 10*3/uL (ref 0.0–0.7)
Eosinophil %: 2.8 %
HCT: 36.6 % — AB (ref 40.0–52.0)
HGB: 11.8 g/dL — ABNORMAL LOW (ref 13.0–18.0)
LYMPHS PCT: 23.9 %
Lymphocyte #: 1.5 10*3/uL (ref 1.0–3.6)
MCH: 27.9 pg (ref 26.0–34.0)
MCHC: 32.2 g/dL (ref 32.0–36.0)
MCV: 87 fL (ref 80–100)
Monocyte #: 0.5 x10 3/mm (ref 0.2–1.0)
Monocyte %: 8.4 %
Neutrophil #: 4.2 10*3/uL (ref 1.4–6.5)
Neutrophil %: 64.5 %
PLATELETS: 155 10*3/uL (ref 150–440)
RBC: 4.23 10*6/uL — ABNORMAL LOW (ref 4.40–5.90)
RDW: 16.7 % — ABNORMAL HIGH (ref 11.5–14.5)
WBC: 6.4 10*3/uL (ref 3.8–10.6)

## 2014-08-26 LAB — URINALYSIS, COMPLETE
BILIRUBIN, UR: NEGATIVE
Glucose,UR: NEGATIVE mg/dL (ref 0–75)
KETONE: NEGATIVE
Nitrite: NEGATIVE
Ph: 9 (ref 4.5–8.0)
Protein: 100
RBC,UR: 1733 /HPF (ref 0–5)
SPECIFIC GRAVITY: 1.014 (ref 1.003–1.030)
Squamous Epithelial: NONE SEEN

## 2014-08-26 LAB — BASIC METABOLIC PANEL
Anion Gap: 7 (ref 7–16)
BUN: 14 mg/dL (ref 7–18)
CHLORIDE: 110 mmol/L — AB (ref 98–107)
CO2: 26 mmol/L (ref 21–32)
CREATININE: 1.59 mg/dL — AB (ref 0.60–1.30)
Calcium, Total: 8.7 mg/dL (ref 8.5–10.1)
EGFR (African American): 53 — ABNORMAL LOW
EGFR (Non-African Amer.): 46 — ABNORMAL LOW
Glucose: 90 mg/dL (ref 65–99)
OSMOLALITY: 285 (ref 275–301)
Potassium: 3.6 mmol/L (ref 3.5–5.1)
Sodium: 143 mmol/L (ref 136–145)

## 2014-08-26 LAB — PROTIME-INR
INR: 2.2
Prothrombin Time: 24.2 secs — ABNORMAL HIGH (ref 11.5–14.7)

## 2014-08-29 LAB — URINE CULTURE

## 2014-09-19 ENCOUNTER — Ambulatory Visit: Payer: Self-pay | Admitting: Internal Medicine

## 2014-09-25 LAB — HEPATIC FUNCTION PANEL A (ARMC)
ALBUMIN: 3.7 g/dL (ref 3.4–5.0)
ALT: 43 U/L
Alkaline Phosphatase: 138 U/L — ABNORMAL HIGH
Bilirubin, Direct: 0.1 mg/dL (ref 0.0–0.2)
Bilirubin,Total: 0.3 mg/dL (ref 0.2–1.0)
SGOT(AST): 21 U/L (ref 15–37)
Total Protein: 7.6 g/dL (ref 6.4–8.2)

## 2014-09-25 LAB — BASIC METABOLIC PANEL
Anion Gap: 9 (ref 7–16)
BUN: 16 mg/dL (ref 7–18)
CALCIUM: 9.4 mg/dL (ref 8.5–10.1)
CO2: 28 mmol/L (ref 21–32)
Chloride: 104 mmol/L (ref 98–107)
Creatinine: 1.41 mg/dL — ABNORMAL HIGH (ref 0.60–1.30)
GFR CALC NON AF AMER: 54 — AB
Glucose: 96 mg/dL (ref 65–99)
OSMOLALITY: 282 (ref 275–301)
Potassium: 3.8 mmol/L (ref 3.5–5.1)
Sodium: 141 mmol/L (ref 136–145)

## 2014-09-25 LAB — CBC CANCER CENTER
Basophil #: 0 x10 3/mm (ref 0.0–0.1)
Basophil %: 0.4 %
EOS ABS: 0.1 x10 3/mm (ref 0.0–0.7)
EOS PCT: 1.7 %
HCT: 38.7 % — ABNORMAL LOW (ref 40.0–52.0)
HGB: 12.3 g/dL — ABNORMAL LOW (ref 13.0–18.0)
LYMPHS ABS: 1.4 x10 3/mm (ref 1.0–3.6)
Lymphocyte %: 19.2 %
MCH: 27.3 pg (ref 26.0–34.0)
MCHC: 31.7 g/dL — ABNORMAL LOW (ref 32.0–36.0)
MCV: 86 fL (ref 80–100)
Monocyte #: 0.6 x10 3/mm (ref 0.2–1.0)
Monocyte %: 8.6 %
Neutrophil #: 4.9 x10 3/mm (ref 1.4–6.5)
Neutrophil %: 70.1 %
Platelet: 186 x10 3/mm (ref 150–440)
RBC: 4.49 10*6/uL (ref 4.40–5.90)
RDW: 16.6 % — AB (ref 11.5–14.5)
WBC: 7 x10 3/mm (ref 3.8–10.6)

## 2014-09-25 LAB — PROTIME-INR
INR: 2.1
Prothrombin Time: 23.1 secs — ABNORMAL HIGH (ref 11.5–14.7)

## 2014-10-02 LAB — CBC CANCER CENTER
BASOS ABS: 0 x10 3/mm (ref 0.0–0.1)
Basophil %: 0.7 %
Eosinophil #: 0 x10 3/mm (ref 0.0–0.7)
Eosinophil %: 1.4 %
HCT: 35.3 % — ABNORMAL LOW (ref 40.0–52.0)
HGB: 11.4 g/dL — ABNORMAL LOW (ref 13.0–18.0)
LYMPHS PCT: 19.7 %
Lymphocyte #: 0.4 x10 3/mm — ABNORMAL LOW (ref 1.0–3.6)
MCH: 27.5 pg (ref 26.0–34.0)
MCHC: 32.2 g/dL (ref 32.0–36.0)
MCV: 85 fL (ref 80–100)
MONOS PCT: 5.2 %
Monocyte #: 0.1 x10 3/mm — ABNORMAL LOW (ref 0.2–1.0)
NEUTROS ABS: 1.4 x10 3/mm (ref 1.4–6.5)
Neutrophil %: 73 %
Platelet: 122 x10 3/mm — ABNORMAL LOW (ref 150–440)
RBC: 4.13 10*6/uL — ABNORMAL LOW (ref 4.40–5.90)
RDW: 16.1 % — AB (ref 11.5–14.5)
WBC: 1.9 x10 3/mm — AB (ref 3.8–10.6)

## 2014-10-02 LAB — PROTIME-INR
INR: 2.2
Prothrombin Time: 23.8 secs — ABNORMAL HIGH (ref 11.5–14.7)

## 2014-10-08 ENCOUNTER — Ambulatory Visit: Payer: Self-pay | Admitting: Internal Medicine

## 2014-10-09 LAB — CBC CANCER CENTER
BASOS PCT: 0.7 %
Basophil #: 0 x10 3/mm (ref 0.0–0.1)
Eosinophil #: 0.1 x10 3/mm (ref 0.0–0.7)
Eosinophil %: 2.1 %
HCT: 33.3 % — ABNORMAL LOW (ref 40.0–52.0)
HGB: 10.5 g/dL — AB (ref 13.0–18.0)
Lymphocyte #: 1 x10 3/mm (ref 1.0–3.6)
Lymphocyte %: 20.6 %
MCH: 27.3 pg (ref 26.0–34.0)
MCHC: 31.6 g/dL — AB (ref 32.0–36.0)
MCV: 87 fL (ref 80–100)
MONOS PCT: 11.5 %
Monocyte #: 0.6 x10 3/mm (ref 0.2–1.0)
NEUTROS ABS: 3.2 x10 3/mm (ref 1.4–6.5)
Neutrophil %: 65.1 %
PLATELETS: 190 x10 3/mm (ref 150–440)
RBC: 3.85 10*6/uL — AB (ref 4.40–5.90)
RDW: 16.4 % — AB (ref 11.5–14.5)
WBC: 4.9 x10 3/mm (ref 3.8–10.6)

## 2014-10-09 LAB — PROTIME-INR
INR: 2.8
Prothrombin Time: 28.6 secs — ABNORMAL HIGH (ref 11.5–14.7)

## 2014-10-16 LAB — CBC CANCER CENTER
BASOS ABS: 0 x10 3/mm (ref 0.0–0.1)
BASOS PCT: 0.6 %
EOS PCT: 1.5 %
Eosinophil #: 0.1 x10 3/mm (ref 0.0–0.7)
HCT: 34.5 % — AB (ref 40.0–52.0)
HGB: 11 g/dL — AB (ref 13.0–18.0)
LYMPHS ABS: 1.4 x10 3/mm (ref 1.0–3.6)
Lymphocyte %: 16.9 %
MCH: 27.5 pg (ref 26.0–34.0)
MCHC: 31.7 g/dL — ABNORMAL LOW (ref 32.0–36.0)
MCV: 87 fL (ref 80–100)
Monocyte #: 0.9 x10 3/mm (ref 0.2–1.0)
Monocyte %: 10.8 %
Neutrophil #: 5.8 x10 3/mm (ref 1.4–6.5)
Neutrophil %: 70.2 %
Platelet: 266 x10 3/mm (ref 150–440)
RBC: 3.98 10*6/uL — ABNORMAL LOW (ref 4.40–5.90)
RDW: 16.5 % — ABNORMAL HIGH (ref 11.5–14.5)
WBC: 8.2 x10 3/mm (ref 3.8–10.6)

## 2014-10-16 LAB — BASIC METABOLIC PANEL
ANION GAP: 10 (ref 7–16)
BUN: 11 mg/dL (ref 7–18)
CHLORIDE: 104 mmol/L (ref 98–107)
CREATININE: 1.57 mg/dL — AB (ref 0.60–1.30)
Calcium, Total: 9 mg/dL (ref 8.5–10.1)
Co2: 27 mmol/L (ref 21–32)
EGFR (Non-African Amer.): 48 — ABNORMAL LOW
GFR CALC AF AMER: 58 — AB
Glucose: 86 mg/dL (ref 65–99)
Osmolality: 280 (ref 275–301)
POTASSIUM: 3.8 mmol/L (ref 3.5–5.1)
Sodium: 141 mmol/L (ref 136–145)

## 2014-10-16 LAB — PROTIME-INR
INR: 3
PROTHROMBIN TIME: 29.9 s — AB (ref 11.5–14.7)

## 2014-10-23 ENCOUNTER — Emergency Department: Payer: Self-pay | Admitting: Emergency Medicine

## 2014-10-23 LAB — URINALYSIS, COMPLETE
BILIRUBIN, UR: NEGATIVE
GLUCOSE, UR: NEGATIVE mg/dL (ref 0–75)
Ketone: NEGATIVE
Nitrite: NEGATIVE
Ph: 6 (ref 4.5–8.0)
RBC,UR: 21 /HPF (ref 0–5)
SPECIFIC GRAVITY: 1.012 (ref 1.003–1.030)
Squamous Epithelial: <1

## 2014-10-23 LAB — DIFFERENTIAL
Basophil #: 0 10*3/uL (ref 0.0–0.1)
Basophil %: 0.6 %
Eosinophil #: 0 10*3/uL (ref 0.0–0.7)
Eosinophil %: 2 %
Lymphocyte #: 0.3 10*3/uL — ABNORMAL LOW (ref 1.0–3.6)
Lymphocyte %: 21.8 %
MONOS PCT: 10.2 %
Monocyte #: 0.1 x10 3/mm — ABNORMAL LOW (ref 0.2–1.0)
NEUTROS PCT: 65.4 %
Neutrophil #: 0.9 10*3/uL — ABNORMAL LOW (ref 1.4–6.5)

## 2014-10-23 LAB — COMPREHENSIVE METABOLIC PANEL
ANION GAP: 6 — AB (ref 7–16)
Albumin: 2.7 g/dL — ABNORMAL LOW (ref 3.4–5.0)
Alkaline Phosphatase: 112 U/L
BUN: 16 mg/dL (ref 7–18)
Bilirubin,Total: 1.1 mg/dL — ABNORMAL HIGH (ref 0.2–1.0)
CALCIUM: 8.7 mg/dL (ref 8.5–10.1)
CREATININE: 1.68 mg/dL — AB (ref 0.60–1.30)
Chloride: 103 mmol/L (ref 98–107)
Co2: 29 mmol/L (ref 21–32)
EGFR (Non-African Amer.): 44 — ABNORMAL LOW
GFR CALC AF AMER: 54 — AB
Glucose: 118 mg/dL — ABNORMAL HIGH (ref 65–99)
Osmolality: 278 (ref 275–301)
POTASSIUM: 3.6 mmol/L (ref 3.5–5.1)
SGOT(AST): 45 U/L — ABNORMAL HIGH (ref 15–37)
SGPT (ALT): 55 U/L
Sodium: 138 mmol/L (ref 136–145)
Total Protein: 7.9 g/dL (ref 6.4–8.2)

## 2014-10-23 LAB — CBC
HCT: 44.5 % (ref 40.0–52.0)
HGB: 14.4 g/dL (ref 13.0–18.0)
MCH: 27.8 pg (ref 26.0–34.0)
MCHC: 32.4 g/dL (ref 32.0–36.0)
MCV: 86 fL (ref 80–100)
PLATELETS: 105 10*3/uL — AB (ref 150–440)
RBC: 5.19 10*6/uL (ref 4.40–5.90)
RDW: 16.7 % — ABNORMAL HIGH (ref 11.5–14.5)
WBC: 1.3 10*3/uL — CL (ref 3.8–10.6)

## 2014-10-23 LAB — TROPONIN I: Troponin-I: <0.02 ng/mL

## 2014-10-27 ENCOUNTER — Emergency Department: Payer: Self-pay | Admitting: Emergency Medicine

## 2014-10-27 LAB — COMPREHENSIVE METABOLIC PANEL
ALBUMIN: 2.7 g/dL — AB (ref 3.4–5.0)
ALK PHOS: 114 U/L
ANION GAP: 9 (ref 7–16)
BILIRUBIN TOTAL: 0.4 mg/dL (ref 0.2–1.0)
BUN: 7 mg/dL (ref 7–18)
CHLORIDE: 105 mmol/L (ref 98–107)
Calcium, Total: 8.9 mg/dL (ref 8.5–10.1)
Co2: 25 mmol/L (ref 21–32)
Creatinine: 1.6 mg/dL — ABNORMAL HIGH (ref 0.60–1.30)
EGFR (African American): 57 — ABNORMAL LOW
EGFR (Non-African Amer.): 47 — ABNORMAL LOW
Glucose: 97 mg/dL (ref 65–99)
Osmolality: 275 (ref 275–301)
POTASSIUM: 3.3 mmol/L — AB (ref 3.5–5.1)
SGOT(AST): 56 U/L — ABNORMAL HIGH (ref 15–37)
SGPT (ALT): 62 U/L
SODIUM: 139 mmol/L (ref 136–145)
Total Protein: 8.2 g/dL (ref 6.4–8.2)

## 2014-10-27 LAB — URINALYSIS, COMPLETE
Bilirubin,UR: NEGATIVE
Glucose,UR: NEGATIVE mg/dL (ref 0–75)
Ketone: NEGATIVE
NITRITE: NEGATIVE
Ph: 6 (ref 4.5–8.0)
Protein: 30
RBC,UR: 146 /HPF (ref 0–5)
Specific Gravity: 1.011 (ref 1.003–1.030)
WBC UR: 91 /HPF (ref 0–5)

## 2014-10-27 LAB — CBC WITH DIFFERENTIAL/PLATELET
Basophil #: 0 10*3/uL (ref 0.0–0.1)
Basophil %: 0.6 %
Eosinophil #: 0 10*3/uL (ref 0.0–0.7)
Eosinophil %: 0.6 %
HCT: 31.5 % — ABNORMAL LOW (ref 40.0–52.0)
HGB: 10 g/dL — AB (ref 13.0–18.0)
Lymphocyte #: 0.8 10*3/uL — ABNORMAL LOW (ref 1.0–3.6)
Lymphocyte %: 17.5 %
MCH: 27.6 pg (ref 26.0–34.0)
MCHC: 31.6 g/dL — ABNORMAL LOW (ref 32.0–36.0)
MCV: 87 fL (ref 80–100)
Monocyte #: 0.6 x10 3/mm (ref 0.2–1.0)
Monocyte %: 12.5 %
NEUTROS ABS: 3.1 10*3/uL (ref 1.4–6.5)
Neutrophil %: 68.8 %
Platelet: 145 10*3/uL — ABNORMAL LOW (ref 150–440)
RBC: 3.61 10*6/uL — ABNORMAL LOW (ref 4.40–5.90)
RDW: 16.3 % — AB (ref 11.5–14.5)
WBC: 4.6 10*3/uL (ref 3.8–10.6)

## 2014-10-27 LAB — TROPONIN I

## 2014-10-27 LAB — LIPASE, BLOOD: Lipase: 114 U/L (ref 73–393)

## 2014-10-30 LAB — HEPATIC FUNCTION PANEL A (ARMC)
Albumin: 2.8 g/dL — ABNORMAL LOW (ref 3.4–5.0)
Alkaline Phosphatase: 122 U/L — ABNORMAL HIGH
Bilirubin, Direct: 0.1 mg/dL (ref 0.0–0.2)
Bilirubin,Total: 0.3 mg/dL (ref 0.2–1.0)
SGOT(AST): 70 U/L — ABNORMAL HIGH (ref 15–37)
SGPT (ALT): 70 U/L — ABNORMAL HIGH
TOTAL PROTEIN: 8.2 g/dL (ref 6.4–8.2)

## 2014-10-30 LAB — CBC CANCER CENTER
BASOS PCT: 0.3 %
Basophil #: 0 x10 3/mm (ref 0.0–0.1)
EOS PCT: 1 %
Eosinophil #: 0 x10 3/mm (ref 0.0–0.7)
HCT: 31.8 % — ABNORMAL LOW (ref 40.0–52.0)
HGB: 9.8 g/dL — ABNORMAL LOW (ref 13.0–18.0)
Lymphocyte #: 0.6 x10 3/mm — ABNORMAL LOW (ref 1.0–3.6)
Lymphocyte %: 12 %
MCH: 27.1 pg (ref 26.0–34.0)
MCHC: 30.8 g/dL — ABNORMAL LOW (ref 32.0–36.0)
MCV: 88 fL (ref 80–100)
Monocyte #: 0.6 x10 3/mm (ref 0.2–1.0)
Monocyte %: 13.1 %
NEUTROS PCT: 73.6 %
Neutrophil #: 3.5 x10 3/mm (ref 1.4–6.5)
PLATELETS: 181 x10 3/mm (ref 150–440)
RBC: 3.62 10*6/uL — AB (ref 4.40–5.90)
RDW: 17.5 % — AB (ref 11.5–14.5)
WBC: 4.7 x10 3/mm (ref 3.8–10.6)

## 2014-10-30 LAB — BASIC METABOLIC PANEL
ANION GAP: 12 (ref 7–16)
BUN: 6 mg/dL — AB (ref 7–18)
CHLORIDE: 103 mmol/L (ref 98–107)
Calcium, Total: 9.3 mg/dL (ref 8.5–10.1)
Co2: 26 mmol/L (ref 21–32)
Creatinine: 1.58 mg/dL — ABNORMAL HIGH (ref 0.60–1.30)
EGFR (Non-African Amer.): 47 — ABNORMAL LOW
GFR CALC AF AMER: 58 — AB
GLUCOSE: 107 mg/dL — AB (ref 65–99)
Osmolality: 279 (ref 275–301)
Potassium: 3.4 mmol/L — ABNORMAL LOW (ref 3.5–5.1)
Sodium: 141 mmol/L (ref 136–145)

## 2014-10-30 LAB — PROTIME-INR
INR: 2.9
Prothrombin Time: 29.7 secs — ABNORMAL HIGH (ref 11.5–14.7)

## 2014-11-06 LAB — CBC CANCER CENTER
BASOS ABS: 0.2 x10 3/mm — AB (ref 0.0–0.1)
Basophil %: 3 %
EOS ABS: 0.1 x10 3/mm (ref 0.0–0.7)
EOS PCT: 0.8 %
HCT: 32.2 % — ABNORMAL LOW (ref 40.0–52.0)
HGB: 10.2 g/dL — ABNORMAL LOW (ref 13.0–18.0)
LYMPHS PCT: 11.7 %
Lymphocyte #: 0.9 x10 3/mm — ABNORMAL LOW (ref 1.0–3.6)
MCH: 27.7 pg (ref 26.0–34.0)
MCHC: 31.6 g/dL — ABNORMAL LOW (ref 32.0–36.0)
MCV: 87 fL (ref 80–100)
MONO ABS: 1.3 x10 3/mm — AB (ref 0.2–1.0)
Monocyte %: 16.3 %
NEUTROS ABS: 5.3 x10 3/mm (ref 1.4–6.5)
Neutrophil %: 68.2 %
Platelet: 218 x10 3/mm (ref 150–440)
RBC: 3.69 10*6/uL — ABNORMAL LOW (ref 4.40–5.90)
RDW: 19.2 % — AB (ref 11.5–14.5)
WBC: 7.7 x10 3/mm (ref 3.8–10.6)

## 2014-11-06 LAB — PROTIME-INR
INR: 2.6
Prothrombin Time: 27.1 secs — ABNORMAL HIGH (ref 11.5–14.7)

## 2014-11-06 LAB — CREATININE, SERUM
CREATININE: 1.46 mg/dL — AB (ref 0.60–1.30)
EGFR (African American): >60
EGFR (Non-African Amer.): 52 — ABNORMAL LOW

## 2014-11-07 ENCOUNTER — Ambulatory Visit: Payer: Self-pay | Admitting: Internal Medicine

## 2014-11-13 ENCOUNTER — Ambulatory Visit: Payer: Self-pay | Admitting: Internal Medicine

## 2014-11-13 LAB — CBC CANCER CENTER
Basophil #: 0 x10 3/mm (ref 0.0–0.1)
Basophil %: 0.5 %
EOS PCT: 3.1 %
Eosinophil #: 0.1 x10 3/mm (ref 0.0–0.7)
HCT: 29.5 % — AB (ref 40.0–52.0)
HGB: 9.4 g/dL — AB (ref 13.0–18.0)
LYMPHS PCT: 34 %
Lymphocyte #: 0.6 x10 3/mm — ABNORMAL LOW (ref 1.0–3.6)
MCH: 27.6 pg (ref 26.0–34.0)
MCHC: 31.7 g/dL — AB (ref 32.0–36.0)
MCV: 87 fL (ref 80–100)
MONO ABS: 0.1 x10 3/mm — AB (ref 0.2–1.0)
Monocyte %: 6.4 %
Neutrophil #: 0.9 x10 3/mm — ABNORMAL LOW (ref 1.4–6.5)
Neutrophil %: 56 %
Platelet: 119 x10 3/mm — ABNORMAL LOW (ref 150–440)
RBC: 3.39 10*6/uL — ABNORMAL LOW (ref 4.40–5.90)
RDW: 18.8 % — AB (ref 11.5–14.5)
WBC: 1.7 x10 3/mm — AB (ref 3.8–10.6)

## 2014-11-13 LAB — PROTIME-INR
INR: 1.6
Prothrombin Time: 18.5 secs — ABNORMAL HIGH (ref 11.5–14.7)

## 2014-11-14 ENCOUNTER — Emergency Department: Payer: Self-pay | Admitting: Emergency Medicine

## 2014-11-14 LAB — COMPREHENSIVE METABOLIC PANEL
ANION GAP: 8 (ref 7–16)
Albumin: 2.8 g/dL — ABNORMAL LOW (ref 3.4–5.0)
Alkaline Phosphatase: 123 U/L — ABNORMAL HIGH
BILIRUBIN TOTAL: 0.4 mg/dL (ref 0.2–1.0)
BUN: 15 mg/dL (ref 7–18)
CALCIUM: 9.4 mg/dL (ref 8.5–10.1)
CREATININE: 1.45 mg/dL — AB (ref 0.60–1.30)
Chloride: 103 mmol/L (ref 98–107)
Co2: 25 mmol/L (ref 21–32)
GFR CALC NON AF AMER: 52 — AB
GLUCOSE: 111 mg/dL — AB (ref 65–99)
Osmolality: 273 (ref 275–301)
Potassium: 3.7 mmol/L (ref 3.5–5.1)
SGOT(AST): 91 U/L — ABNORMAL HIGH (ref 15–37)
SGPT (ALT): 111 U/L — ABNORMAL HIGH
Sodium: 136 mmol/L (ref 136–145)
TOTAL PROTEIN: 8.3 g/dL — AB (ref 6.4–8.2)

## 2014-11-14 LAB — URINALYSIS, COMPLETE
Bilirubin,UR: NEGATIVE
Blood: NEGATIVE
Glucose,UR: NEGATIVE mg/dL (ref 0–75)
Ketone: NEGATIVE
NITRITE: POSITIVE
Ph: 8 (ref 4.5–8.0)
RBC,UR: 42 /HPF (ref 0–5)
Specific Gravity: 1.016 (ref 1.003–1.030)
Squamous Epithelial: <1
WBC UR: 43 /HPF (ref 0–5)

## 2014-11-14 LAB — CBC WITH DIFFERENTIAL/PLATELET
Basophil #: 0 10*3/uL (ref 0.0–0.1)
Basophil %: 0.4 %
EOS ABS: 0.1 10*3/uL (ref 0.0–0.7)
Eosinophil %: 4 %
HCT: 29.2 % — AB (ref 40.0–52.0)
HGB: 9.4 g/dL — AB (ref 13.0–18.0)
LYMPHS ABS: 0.7 10*3/uL — AB (ref 1.0–3.6)
LYMPHS PCT: 33.7 %
MCH: 28 pg (ref 26.0–34.0)
MCHC: 32 g/dL (ref 32.0–36.0)
MCV: 88 fL (ref 80–100)
MONOS PCT: 11.3 %
Monocyte #: 0.2 x10 3/mm (ref 0.2–1.0)
Neutrophil #: 1 10*3/uL — ABNORMAL LOW (ref 1.4–6.5)
Neutrophil %: 50.6 %
Platelet: 112 10*3/uL — ABNORMAL LOW (ref 150–440)
RBC: 3.34 10*6/uL — AB (ref 4.40–5.90)
RDW: 18.3 % — ABNORMAL HIGH (ref 11.5–14.5)
WBC: 2 10*3/uL — AB (ref 3.8–10.6)

## 2014-11-14 LAB — PROTIME-INR
INR: 1.7
Prothrombin Time: 19.8 secs — ABNORMAL HIGH (ref 11.5–14.7)

## 2014-11-16 LAB — URINE CULTURE

## 2014-11-17 LAB — CBC CANCER CENTER
BASOS ABS: 0 x10 3/mm (ref 0.0–0.1)
BASOS PCT: 0.1 %
EOS ABS: 0 x10 3/mm (ref 0.0–0.7)
EOS PCT: 1.7 %
HCT: 27.6 % — ABNORMAL LOW (ref 40.0–52.0)
HGB: 8.8 g/dL — ABNORMAL LOW (ref 13.0–18.0)
Lymphocyte #: 0.9 x10 3/mm — ABNORMAL LOW (ref 1.0–3.6)
Lymphocyte %: 32.9 %
MCH: 27.8 pg (ref 26.0–34.0)
MCHC: 31.7 g/dL — AB (ref 32.0–36.0)
MCV: 88 fL (ref 80–100)
Monocyte #: 0.6 x10 3/mm (ref 0.2–1.0)
Monocyte %: 20.6 %
NEUTROS ABS: 1.3 x10 3/mm — AB (ref 1.4–6.5)
NEUTROS PCT: 44.7 %
PLATELETS: 143 x10 3/mm — AB (ref 150–440)
RBC: 3.15 10*6/uL — AB (ref 4.40–5.90)
RDW: 18.8 % — ABNORMAL HIGH (ref 11.5–14.5)
WBC: 2.8 x10 3/mm — AB (ref 3.8–10.6)

## 2014-11-17 LAB — HEPATIC FUNCTION PANEL A (ARMC)
ALT: 88 U/L — AB
AST: 66 U/L — AB (ref 15–37)
Albumin: 2.8 g/dL — ABNORMAL LOW (ref 3.4–5.0)
Alkaline Phosphatase: 126 U/L — ABNORMAL HIGH
Bilirubin, Direct: 0.1 mg/dL (ref 0.0–0.2)
Bilirubin,Total: 0.3 mg/dL (ref 0.2–1.0)
Total Protein: 7.8 g/dL (ref 6.4–8.2)

## 2014-11-17 LAB — BASIC METABOLIC PANEL
Anion Gap: 11 (ref 7–16)
BUN: 10 mg/dL (ref 7–18)
CALCIUM: 8.8 mg/dL (ref 8.5–10.1)
CHLORIDE: 102 mmol/L (ref 98–107)
CO2: 28 mmol/L (ref 21–32)
Creatinine: 1.37 mg/dL — ABNORMAL HIGH (ref 0.60–1.30)
EGFR (African American): >60
EGFR (Non-African Amer.): 56 — ABNORMAL LOW
GLUCOSE: 94 mg/dL (ref 65–99)
Osmolality: 280 (ref 275–301)
Potassium: 3.7 mmol/L (ref 3.5–5.1)
Sodium: 141 mmol/L (ref 136–145)

## 2014-11-17 LAB — PROTIME-INR
INR: 2
PROTHROMBIN TIME: 22.5 s — AB (ref 11.5–14.7)

## 2014-11-27 LAB — CBC CANCER CENTER
Basophil #: 0 x10 3/mm (ref 0.0–0.1)
Basophil %: 0.5 %
Eosinophil #: 0.1 x10 3/mm (ref 0.0–0.7)
Eosinophil %: 2.1 %
HCT: 31 % — ABNORMAL LOW (ref 40.0–52.0)
HGB: 9.8 g/dL — AB (ref 13.0–18.0)
Lymphocyte #: 1.6 x10 3/mm (ref 1.0–3.6)
Lymphocyte %: 21.8 %
MCH: 28 pg (ref 26.0–34.0)
MCHC: 31.5 g/dL — ABNORMAL LOW (ref 32.0–36.0)
MCV: 89 fL (ref 80–100)
MONOS PCT: 12 %
Monocyte #: 0.9 x10 3/mm (ref 0.2–1.0)
Neutrophil #: 4.6 x10 3/mm (ref 1.4–6.5)
Neutrophil %: 63.6 %
Platelet: 228 x10 3/mm (ref 150–440)
RBC: 3.49 10*6/uL — ABNORMAL LOW (ref 4.40–5.90)
RDW: 20.7 % — AB (ref 11.5–14.5)
WBC: 7.2 x10 3/mm (ref 3.8–10.6)

## 2014-11-27 LAB — PROTIME-INR
INR: 2.2
PROTHROMBIN TIME: 23.5 s — AB (ref 11.5–14.7)

## 2014-12-03 ENCOUNTER — Observation Stay: Payer: Self-pay | Admitting: Internal Medicine

## 2014-12-03 LAB — URINALYSIS, COMPLETE
BACTERIA: NONE SEEN
Bilirubin,UR: NEGATIVE
GLUCOSE, UR: NEGATIVE mg/dL (ref 0–75)
KETONE: NEGATIVE
NITRITE: NEGATIVE
PH: 7 (ref 4.5–8.0)
Specific Gravity: 1.021 (ref 1.003–1.030)
Squamous Epithelial: NONE SEEN
WBC UR: 13 /HPF (ref 0–5)

## 2014-12-03 LAB — CBC
HCT: 27.8 % — ABNORMAL LOW (ref 40.0–52.0)
HGB: 8.7 g/dL — ABNORMAL LOW (ref 13.0–18.0)
MCH: 28.2 pg (ref 26.0–34.0)
MCHC: 31.2 g/dL — ABNORMAL LOW (ref 32.0–36.0)
MCV: 90 fL (ref 80–100)
Platelet: 123 10*3/uL — ABNORMAL LOW (ref 150–440)
RBC: 3.08 10*6/uL — ABNORMAL LOW (ref 4.40–5.90)
RDW: 19.5 % — ABNORMAL HIGH (ref 11.5–14.5)
WBC: 2 10*3/uL — CL (ref 3.8–10.6)

## 2014-12-03 LAB — DIFFERENTIAL
BASOS ABS: 0 10*3/uL (ref 0.0–0.1)
BASOS PCT: 0.4 %
EOS ABS: 0 10*3/uL (ref 0.0–0.7)
Eosinophil %: 1.7 %
LYMPHS PCT: 14.5 %
Lymphocyte #: 0.3 10*3/uL — ABNORMAL LOW (ref 1.0–3.6)
Monocyte #: 0 x10 3/mm — ABNORMAL LOW (ref 0.2–1.0)
Monocyte %: 1.8 %
Neutrophil #: 1.6 10*3/uL (ref 1.4–6.5)
Neutrophil %: 81.6 %

## 2014-12-03 LAB — COMPREHENSIVE METABOLIC PANEL
Albumin: 2.6 g/dL — ABNORMAL LOW (ref 3.4–5.0)
Alkaline Phosphatase: 120 U/L — ABNORMAL HIGH
Anion Gap: 10 (ref 7–16)
BUN: 15 mg/dL (ref 7–18)
Bilirubin,Total: 0.7 mg/dL (ref 0.2–1.0)
Calcium, Total: 8.8 mg/dL (ref 8.5–10.1)
Chloride: 102 mmol/L (ref 98–107)
Co2: 25 mmol/L (ref 21–32)
Creatinine: 1.19 mg/dL (ref 0.60–1.30)
EGFR (African American): >60
EGFR (Non-African Amer.): >60
Glucose: 113 mg/dL — ABNORMAL HIGH (ref 65–99)
Osmolality: 275 (ref 275–301)
Potassium: 3.6 mmol/L (ref 3.5–5.1)
SGOT(AST): 46 U/L — ABNORMAL HIGH (ref 15–37)
SGPT (ALT): 38 U/L
Sodium: 137 mmol/L (ref 136–145)
Total Protein: 7.7 g/dL (ref 6.4–8.2)

## 2014-12-03 LAB — PROTIME-INR
INR: 1.8
Prothrombin Time: 20.9 secs — ABNORMAL HIGH (ref 11.5–14.7)

## 2014-12-03 LAB — TROPONIN I: Troponin-I: <0.02 ng/mL

## 2014-12-03 LAB — LIPASE, BLOOD: Lipase: 69 U/L — ABNORMAL LOW (ref 73–393)

## 2014-12-04 LAB — CBC WITH DIFFERENTIAL/PLATELET
BASOS ABS: 0 10*3/uL (ref 0.0–0.1)
Basophil %: 0.7 %
EOS ABS: 0 10*3/uL (ref 0.0–0.7)
Eosinophil %: 2.7 %
HCT: 27.8 % — AB (ref 40.0–52.0)
HGB: 9 g/dL — AB (ref 13.0–18.0)
LYMPHS ABS: 0.6 10*3/uL — AB (ref 1.0–3.6)
Lymphocyte %: 36.6 %
MCH: 28.8 pg (ref 26.0–34.0)
MCHC: 32.4 g/dL (ref 32.0–36.0)
MCV: 89 fL (ref 80–100)
MONO ABS: 0.1 x10 3/mm — AB (ref 0.2–1.0)
MONOS PCT: 3.7 %
NEUTROS ABS: 0.9 10*3/uL — AB (ref 1.4–6.5)
NEUTROS PCT: 56.3 %
Platelet: 107 10*3/uL — ABNORMAL LOW (ref 150–440)
RBC: 3.12 10*6/uL — ABNORMAL LOW (ref 4.40–5.90)
RDW: 19.3 % — ABNORMAL HIGH (ref 11.5–14.5)
WBC: 1.7 10*3/uL — AB (ref 3.8–10.6)

## 2014-12-04 LAB — HEPATIC FUNCTION PANEL A (ARMC)
Albumin: 2.5 g/dL — ABNORMAL LOW (ref 3.4–5.0)
Alkaline Phosphatase: 107 U/L
BILIRUBIN TOTAL: 0.5 mg/dL (ref 0.2–1.0)
Bilirubin, Direct: 0.2 mg/dL (ref 0.0–0.2)
SGOT(AST): 47 U/L — ABNORMAL HIGH (ref 15–37)
SGPT (ALT): 45 U/L
Total Protein: 7.2 g/dL (ref 6.4–8.2)

## 2014-12-04 LAB — BASIC METABOLIC PANEL
ANION GAP: 10 (ref 7–16)
BUN: 12 mg/dL (ref 7–18)
CALCIUM: 8.5 mg/dL (ref 8.5–10.1)
CO2: 24 mmol/L (ref 21–32)
CREATININE: 1.17 mg/dL (ref 0.60–1.30)
Chloride: 104 mmol/L (ref 98–107)
EGFR (African American): >60
EGFR (Non-African Amer.): >60
GLUCOSE: 87 mg/dL (ref 65–99)
Osmolality: 275 (ref 275–301)
Potassium: 3.4 mmol/L — ABNORMAL LOW (ref 3.5–5.1)
SODIUM: 138 mmol/L (ref 136–145)

## 2014-12-04 LAB — PROTIME-INR
INR: 2
Prothrombin Time: 22.3 secs — ABNORMAL HIGH (ref 11.5–14.7)

## 2014-12-04 LAB — MAGNESIUM: Magnesium: 1.4 mg/dL — ABNORMAL LOW

## 2014-12-05 LAB — CBC WITH DIFFERENTIAL/PLATELET
Basophil #: 0 10*3/uL (ref 0.0–0.1)
Basophil %: 0.4 %
EOS ABS: 0.1 10*3/uL (ref 0.0–0.7)
Eosinophil %: 4.3 %
HCT: 27.2 % — ABNORMAL LOW (ref 40.0–52.0)
HGB: 8.6 g/dL — ABNORMAL LOW (ref 13.0–18.0)
LYMPHS PCT: 42 %
Lymphocyte #: 0.7 10*3/uL — ABNORMAL LOW (ref 1.0–3.6)
MCH: 28.4 pg (ref 26.0–34.0)
MCHC: 31.6 g/dL — AB (ref 32.0–36.0)
MCV: 90 fL (ref 80–100)
MONOS PCT: 4.9 %
Monocyte #: 0.1 x10 3/mm — ABNORMAL LOW (ref 0.2–1.0)
Neutrophil #: 0.8 10*3/uL — ABNORMAL LOW (ref 1.4–6.5)
Neutrophil %: 48.4 %
Platelet: 90 10*3/uL — ABNORMAL LOW (ref 150–440)
RBC: 3.03 10*6/uL — ABNORMAL LOW (ref 4.40–5.90)
RDW: 19.1 % — ABNORMAL HIGH (ref 11.5–14.5)
WBC: 1.6 10*3/uL — CL (ref 3.8–10.6)

## 2014-12-05 LAB — PROTIME-INR
INR: 2.4
Prothrombin Time: 25.5 secs — ABNORMAL HIGH (ref 11.5–14.7)

## 2014-12-06 LAB — URINE CULTURE

## 2014-12-08 ENCOUNTER — Ambulatory Visit: Payer: Self-pay | Admitting: Internal Medicine

## 2014-12-11 LAB — CBC CANCER CENTER
BASOS PCT: 0.5 %
Basophil #: 0 x10 3/mm (ref 0.0–0.1)
EOS PCT: 1.4 %
Eosinophil #: 0.1 x10 3/mm (ref 0.0–0.7)
HCT: 30.6 % — AB (ref 40.0–52.0)
HGB: 9.7 g/dL — ABNORMAL LOW (ref 13.0–18.0)
Lymphocyte #: 1.4 x10 3/mm (ref 1.0–3.6)
Lymphocyte %: 24.3 %
MCH: 27.8 pg (ref 26.0–34.0)
MCHC: 31.6 g/dL — ABNORMAL LOW (ref 32.0–36.0)
MCV: 88 fL (ref 80–100)
Monocyte #: 0.8 x10 3/mm (ref 0.2–1.0)
Monocyte %: 13.7 %
NEUTROS ABS: 3.5 x10 3/mm (ref 1.4–6.5)
NEUTROS PCT: 60.1 %
Platelet: 132 x10 3/mm — ABNORMAL LOW (ref 150–440)
RBC: 3.47 10*6/uL — ABNORMAL LOW (ref 4.40–5.90)
RDW: 20.1 % — ABNORMAL HIGH (ref 11.5–14.5)
WBC: 5.7 x10 3/mm (ref 3.8–10.6)

## 2014-12-11 LAB — HEPATIC FUNCTION PANEL A (ARMC)
ALBUMIN: 3 g/dL — AB (ref 3.4–5.0)
ALK PHOS: 152 U/L — AB
AST: 40 U/L — AB (ref 15–37)
Bilirubin, Direct: 0.1 mg/dL (ref 0.0–0.2)
Bilirubin,Total: 0.4 mg/dL (ref 0.2–1.0)
SGPT (ALT): 61 U/L
Total Protein: 8.1 g/dL (ref 6.4–8.2)

## 2014-12-11 LAB — CREATININE, SERUM
Creatinine: 1.52 mg/dL — ABNORMAL HIGH (ref 0.60–1.30)
EGFR (Non-African Amer.): 50 — ABNORMAL LOW

## 2014-12-11 LAB — PROTIME-INR
INR: 2.3
Prothrombin Time: 24.7 secs — ABNORMAL HIGH (ref 11.5–14.7)

## 2014-12-14 ENCOUNTER — Emergency Department: Payer: Self-pay | Admitting: Emergency Medicine

## 2014-12-14 LAB — COMPREHENSIVE METABOLIC PANEL
ANION GAP: 7 (ref 7–16)
Albumin: 3.1 g/dL — ABNORMAL LOW (ref 3.4–5.0)
Alkaline Phosphatase: 153 U/L — ABNORMAL HIGH
BILIRUBIN TOTAL: 0.5 mg/dL (ref 0.2–1.0)
BUN: 13 mg/dL (ref 7–18)
CHLORIDE: 102 mmol/L (ref 98–107)
CREATININE: 1.51 mg/dL — AB (ref 0.60–1.30)
Calcium, Total: 9.6 mg/dL (ref 8.5–10.1)
Co2: 28 mmol/L (ref 21–32)
EGFR (Non-African Amer.): 50 — ABNORMAL LOW
Glucose: 89 mg/dL (ref 65–99)
Osmolality: 273 (ref 275–301)
Potassium: 4.7 mmol/L (ref 3.5–5.1)
SGOT(AST): 43 U/L — ABNORMAL HIGH (ref 15–37)
SGPT (ALT): 44 U/L
Sodium: 137 mmol/L (ref 136–145)
TOTAL PROTEIN: 8.5 g/dL — AB (ref 6.4–8.2)

## 2014-12-14 LAB — URINALYSIS, COMPLETE
Bilirubin,UR: NEGATIVE
Blood: NEGATIVE
Glucose,UR: NEGATIVE mg/dL (ref 0–75)
Ketone: NEGATIVE
Nitrite: NEGATIVE
Ph: 9 (ref 4.5–8.0)
RBC,UR: 16 /HPF (ref 0–5)
Specific Gravity: 1.013 (ref 1.003–1.030)
Squamous Epithelial: 1

## 2014-12-14 LAB — CBC WITH DIFFERENTIAL/PLATELET
Basophil #: 0 10*3/uL (ref 0.0–0.1)
Basophil %: 0.2 %
EOS ABS: 0.1 10*3/uL (ref 0.0–0.7)
Eosinophil %: 1.7 %
HCT: 31.3 % — ABNORMAL LOW (ref 40.0–52.0)
HGB: 9.6 g/dL — ABNORMAL LOW (ref 13.0–18.0)
LYMPHS PCT: 18.6 %
Lymphocyte #: 1.5 10*3/uL (ref 1.0–3.6)
MCH: 28.2 pg (ref 26.0–34.0)
MCHC: 30.8 g/dL — ABNORMAL LOW (ref 32.0–36.0)
MCV: 92 fL (ref 80–100)
Monocyte #: 1.4 x10 3/mm — ABNORMAL HIGH (ref 0.2–1.0)
Monocyte %: 17.3 %
NEUTROS PCT: 62.2 %
Neutrophil #: 5.1 10*3/uL (ref 1.4–6.5)
Platelet: 195 10*3/uL (ref 150–440)
RBC: 3.41 10*6/uL — ABNORMAL LOW (ref 4.40–5.90)
RDW: 20.3 % — AB (ref 11.5–14.5)
WBC: 8.3 10*3/uL (ref 3.8–10.6)

## 2014-12-14 LAB — LIPASE, BLOOD: LIPASE: 100 U/L (ref 73–393)

## 2014-12-16 LAB — URINE CULTURE

## 2014-12-20 LAB — CBC CANCER CENTER
BASOS ABS: 0 x10 3/mm (ref 0.0–0.1)
BASOS PCT: 0.6 %
Eosinophil #: 0.1 x10 3/mm (ref 0.0–0.7)
Eosinophil %: 1.2 %
HCT: 31.2 % — ABNORMAL LOW (ref 40.0–52.0)
HGB: 9.8 g/dL — ABNORMAL LOW (ref 13.0–18.0)
LYMPHS PCT: 17.6 %
Lymphocyte #: 1.2 x10 3/mm (ref 1.0–3.6)
MCH: 28.1 pg (ref 26.0–34.0)
MCHC: 31.4 g/dL — AB (ref 32.0–36.0)
MCV: 90 fL (ref 80–100)
Monocyte #: 1 x10 3/mm (ref 0.2–1.0)
Monocyte %: 14.5 %
Neutrophil #: 4.4 x10 3/mm (ref 1.4–6.5)
Neutrophil %: 66.1 %
Platelet: 251 x10 3/mm (ref 150–440)
RBC: 3.49 10*6/uL — ABNORMAL LOW (ref 4.40–5.90)
RDW: 19.3 % — AB (ref 11.5–14.5)
WBC: 6.7 x10 3/mm (ref 3.8–10.6)

## 2014-12-20 LAB — PROTIME-INR
INR: 2.3
PROTHROMBIN TIME: 24.6 s — AB (ref 11.5–14.7)

## 2014-12-25 ENCOUNTER — Emergency Department: Payer: Self-pay | Admitting: Emergency Medicine

## 2014-12-25 LAB — COMPREHENSIVE METABOLIC PANEL
AST: 49 U/L — AB (ref 15–37)
Albumin: 2.8 g/dL — ABNORMAL LOW (ref 3.4–5.0)
Alkaline Phosphatase: 147 U/L — ABNORMAL HIGH
Anion Gap: 8 (ref 7–16)
BILIRUBIN TOTAL: 0.7 mg/dL (ref 0.2–1.0)
BUN: 14 mg/dL (ref 7–18)
CREATININE: 1.38 mg/dL — AB (ref 0.60–1.30)
Calcium, Total: 8.9 mg/dL (ref 8.5–10.1)
Chloride: 103 mmol/L (ref 98–107)
Co2: 27 mmol/L (ref 21–32)
EGFR (Non-African Amer.): 56 — ABNORMAL LOW
Glucose: 94 mg/dL (ref 65–99)
Osmolality: 276 (ref 275–301)
POTASSIUM: 3.4 mmol/L — AB (ref 3.5–5.1)
SGPT (ALT): 37 U/L
Sodium: 138 mmol/L (ref 136–145)
Total Protein: 7.9 g/dL (ref 6.4–8.2)

## 2014-12-25 LAB — CBC WITH DIFFERENTIAL/PLATELET
BASOS ABS: 0.1 10*3/uL (ref 0.0–0.1)
Basophil %: 0.6 %
EOS ABS: 0.1 10*3/uL (ref 0.0–0.7)
Eosinophil %: 0.8 %
HCT: 30.6 % — ABNORMAL LOW (ref 40.0–52.0)
HGB: 9.5 g/dL — ABNORMAL LOW (ref 13.0–18.0)
LYMPHS ABS: 0.8 10*3/uL — AB (ref 1.0–3.6)
LYMPHS PCT: 7.3 %
MCH: 28.6 pg (ref 26.0–34.0)
MCHC: 31.2 g/dL — ABNORMAL LOW (ref 32.0–36.0)
MCV: 92 fL (ref 80–100)
Monocyte #: 0.4 x10 3/mm (ref 0.2–1.0)
Monocyte %: 3.3 %
Neutrophil #: 9.3 10*3/uL — ABNORMAL HIGH (ref 1.4–6.5)
Neutrophil %: 88 %
PLATELETS: 189 10*3/uL (ref 150–440)
RBC: 3.34 10*6/uL — ABNORMAL LOW (ref 4.40–5.90)
RDW: 19.2 % — AB (ref 11.5–14.5)
WBC: 10.6 10*3/uL (ref 3.8–10.6)

## 2014-12-25 LAB — LIPASE, BLOOD: LIPASE: 89 U/L (ref 73–393)

## 2014-12-25 LAB — PROTIME-INR
INR: 2.4
Prothrombin Time: 25.6 secs — ABNORMAL HIGH (ref 11.5–14.7)

## 2014-12-25 LAB — TROPONIN I: Troponin-I: <0.02 ng/mL

## 2014-12-27 ENCOUNTER — Emergency Department: Payer: Self-pay | Admitting: Emergency Medicine

## 2014-12-27 LAB — URINALYSIS, COMPLETE
BACTERIA: NONE SEEN
Bilirubin,UR: NEGATIVE
Glucose,UR: NEGATIVE mg/dL (ref 0–75)
Ketone: NEGATIVE
NITRITE: NEGATIVE
Ph: 9 (ref 4.5–8.0)
RBC,UR: 42 /HPF (ref 0–5)
SPECIFIC GRAVITY: 1.015 (ref 1.003–1.030)
Squamous Epithelial: NONE SEEN
WBC UR: 84 /HPF (ref 0–5)

## 2014-12-27 LAB — CBC
HCT: 30.9 % — ABNORMAL LOW (ref 40.0–52.0)
HGB: 9.5 g/dL — ABNORMAL LOW (ref 13.0–18.0)
MCH: 28 pg (ref 26.0–34.0)
MCHC: 30.7 g/dL — ABNORMAL LOW (ref 32.0–36.0)
MCV: 91 fL (ref 80–100)
PLATELETS: 205 10*3/uL (ref 150–440)
RBC: 3.38 10*6/uL — AB (ref 4.40–5.90)
RDW: 19.3 % — ABNORMAL HIGH (ref 11.5–14.5)
WBC: 11.7 10*3/uL — ABNORMAL HIGH (ref 3.8–10.6)

## 2014-12-27 LAB — COMPREHENSIVE METABOLIC PANEL
ALBUMIN: 2.7 g/dL — AB (ref 3.4–5.0)
ALT: 37 U/L
ANION GAP: 7 (ref 7–16)
AST: 33 U/L (ref 15–37)
Alkaline Phosphatase: 147 U/L — ABNORMAL HIGH
BUN: 13 mg/dL (ref 7–18)
Bilirubin,Total: 0.6 mg/dL (ref 0.2–1.0)
Calcium, Total: 9.1 mg/dL (ref 8.5–10.1)
Chloride: 103 mmol/L (ref 98–107)
Co2: 28 mmol/L (ref 21–32)
Creatinine: 1.45 mg/dL — ABNORMAL HIGH (ref 0.60–1.30)
EGFR (African American): >60
EGFR (Non-African Amer.): 52 — ABNORMAL LOW
Glucose: 134 mg/dL — ABNORMAL HIGH (ref 65–99)
Osmolality: 278 (ref 275–301)
POTASSIUM: 3.3 mmol/L — AB (ref 3.5–5.1)
Sodium: 138 mmol/L (ref 136–145)
TOTAL PROTEIN: 8.2 g/dL (ref 6.4–8.2)

## 2014-12-27 LAB — TROPONIN I: Troponin-I: <0.02 ng/mL

## 2014-12-30 LAB — URINE CULTURE

## 2015-01-08 ENCOUNTER — Ambulatory Visit: Payer: Self-pay | Admitting: Internal Medicine

## 2015-02-06 ENCOUNTER — Ambulatory Visit
Admit: 2015-02-06 | Disposition: A | Discharge status: Home / Self Care | Payer: Self-pay | Attending: Internal Medicine | Admitting: Internal Medicine

## 2015-02-06 IMAGING — CR PELVIS - 1-2 VIEW
1 series · 1 of 1 positions shown · non-contrast
Comparison: Role in sagittal reconstructed images through the
pelvis from an abdominal is in pelvic CT scan July 18, 2014

CLINICAL DATA: Low back pain with history of malignancy

EXAM:
PELVIS - 1-2 VIEW

[dxr pelvis ap only]
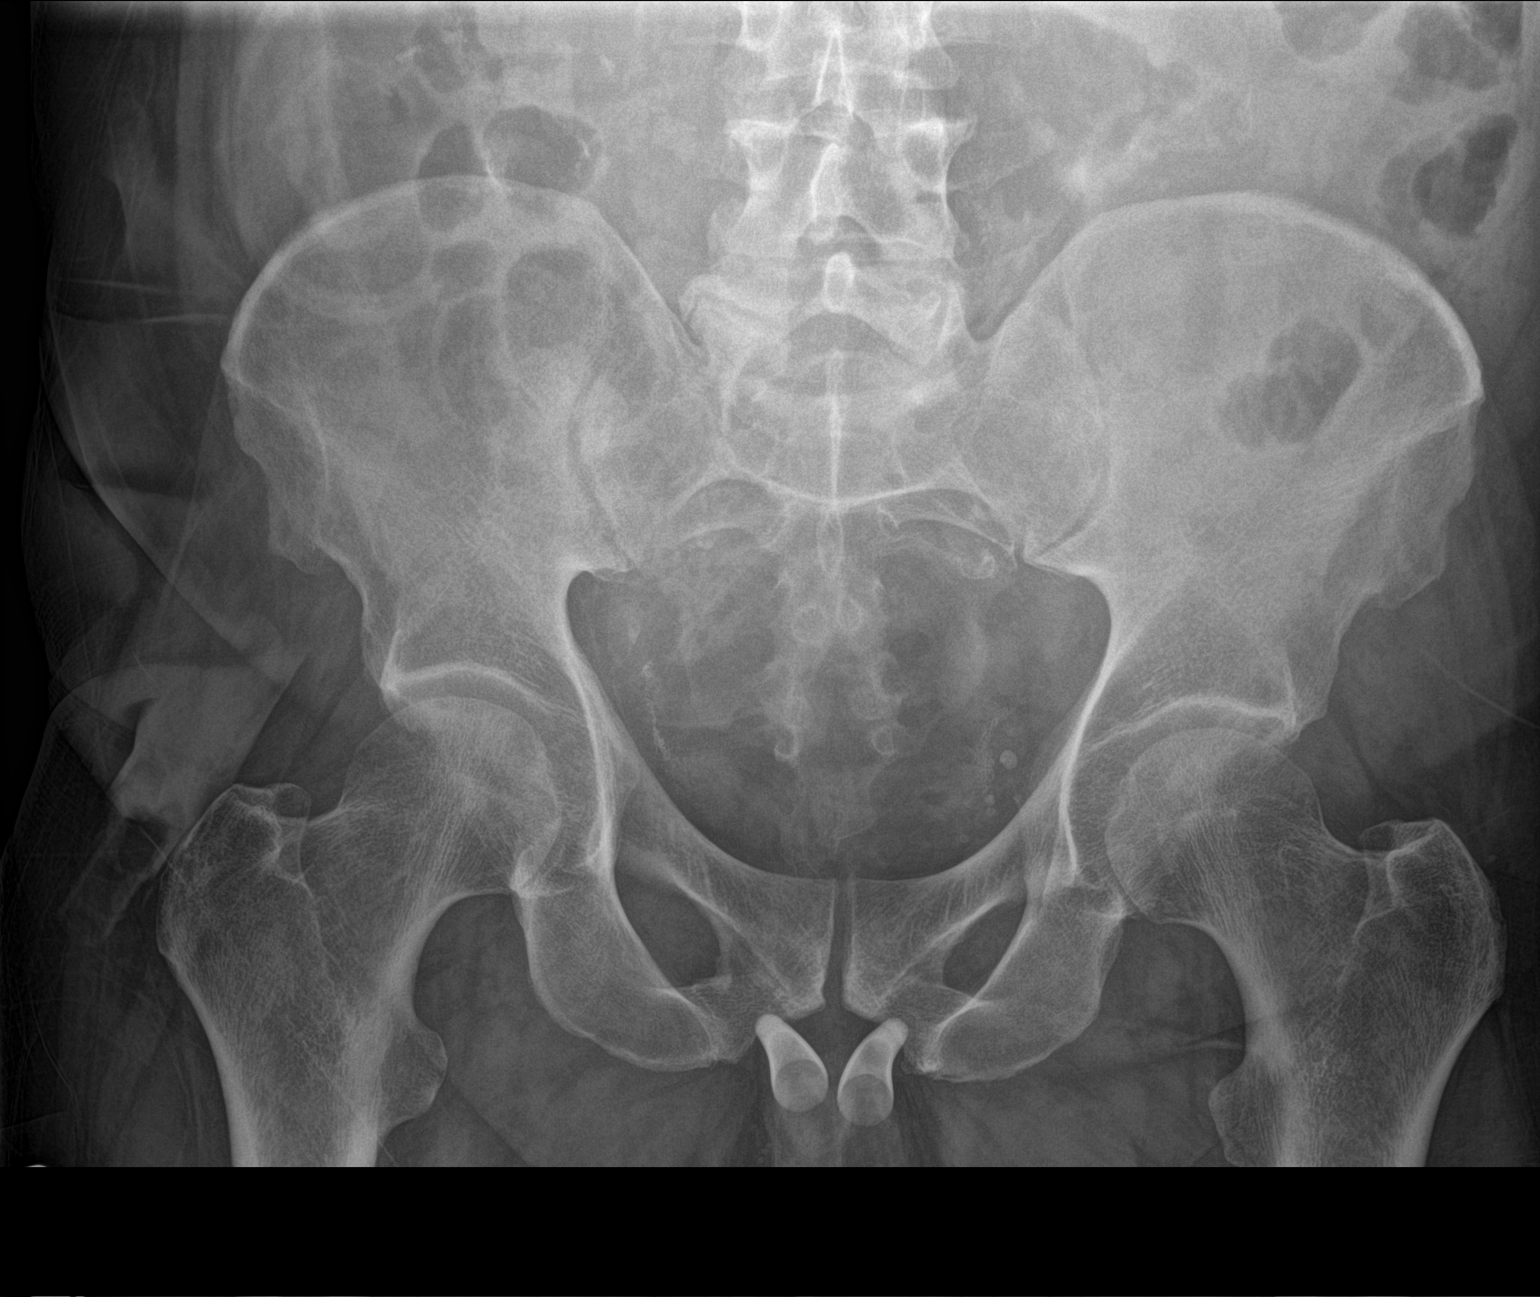

[1 of 1 positions shown; findings below may reference images not displayed]

FINDINGS: The bony pelvis is adequately mineralized for age. The sacrum and SI
joints exhibit no acute abnormalities. No lytic or blastic pelvic
lesions are demonstrated. The observed portions of the hips are
normal.
IMPRESSION: There is no acute bony abnormality of the pelvis.

## 2015-02-06 IMAGING — CR DG CHEST 2V
1 series · 2 of 2 positions shown · non-contrast
Comparison: PA and lateral chest x-ray January 12, 2014

CLINICAL DATA: History of bladder and liver malignancy

EXAM:
CHEST  2 VIEW

[Series 1: dxr chest pa (or ap) and lateral · 0.14mm/px · 2 of 2 slices shown]
[im 1/2]
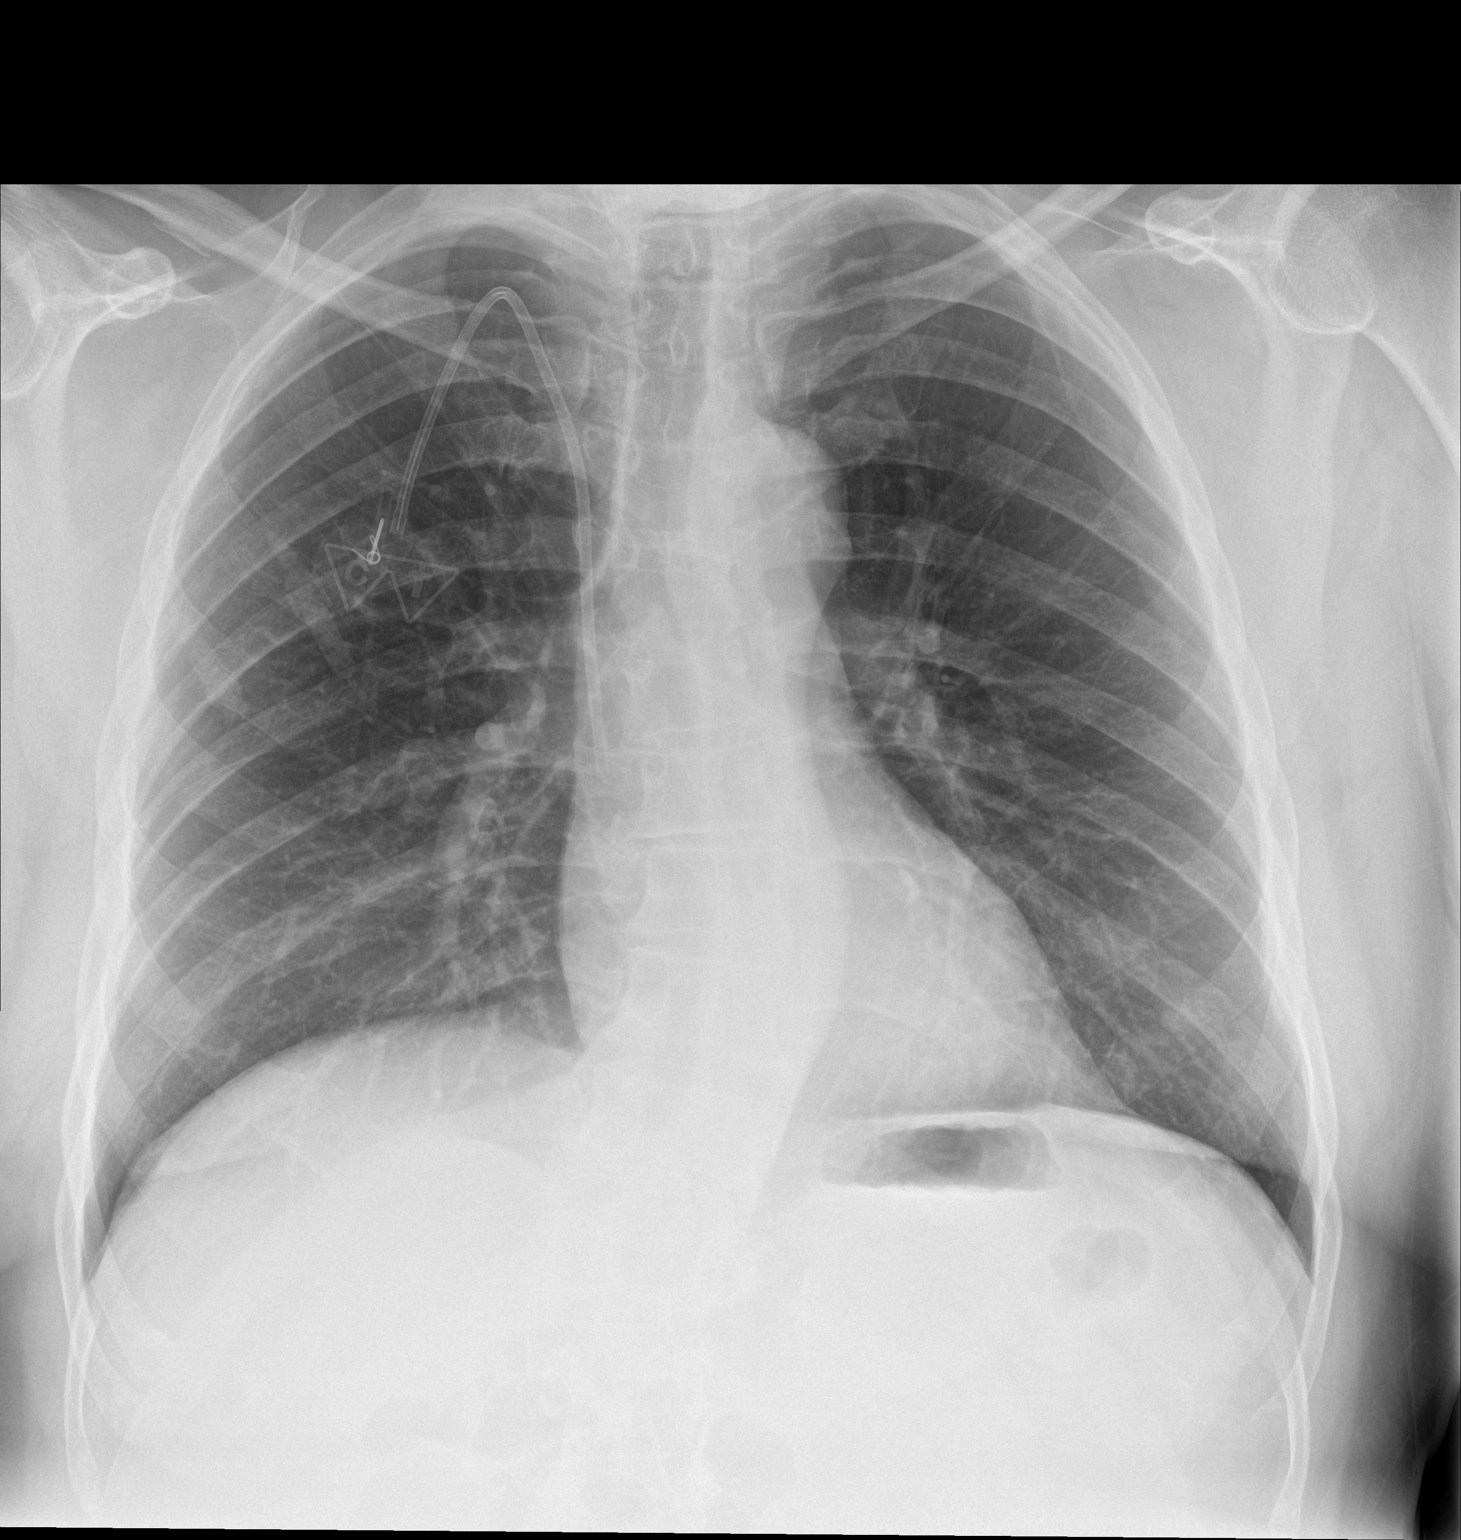
[im 2/2]
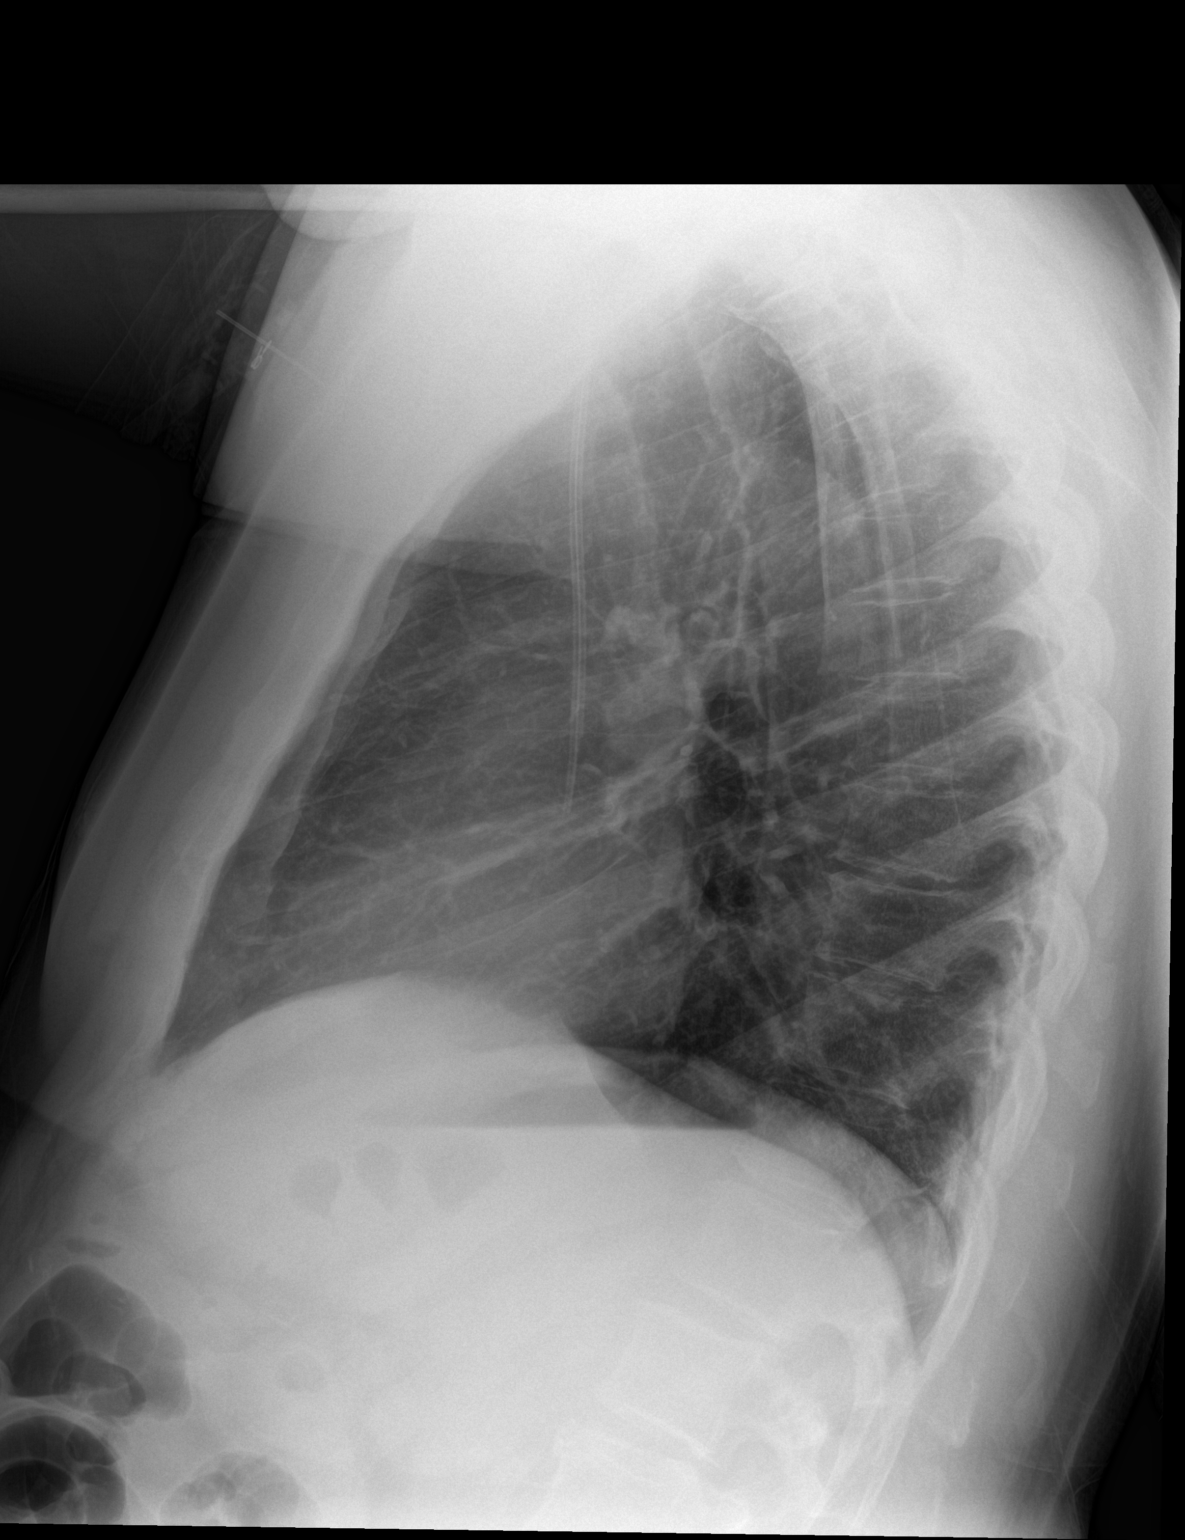

[2 of 2 positions shown; findings below may reference images not displayed]

FINDINGS: The lungs are adequately inflated. There is no focal infiltrate. A
nipple shadow on the left overlies the anterior aspect of the
seventh rib. The heart and pulmonary vascularity are normal. There
is no pleural effusion. The power port catheter tip lies in the
midportion of the SVC. There is mild tortuosity of the descending
thoracic aorta. The bony thorax is unremarkable.
IMPRESSION: There is no acute cardiopulmonary abnormality. There are no findings
to suggest metastatic disease to the thorax.

## 2015-02-09 ENCOUNTER — Emergency Department: Payer: Self-pay | Admitting: Emergency Medicine

## 2015-02-19 ENCOUNTER — Emergency Department: Payer: Self-pay | Admitting: Emergency Medicine

## 2015-03-02 LAB — BASIC METABOLIC PANEL
ANION GAP: 7 (ref 7–16)
BUN: 9 mg/dL
CALCIUM: 8.7 mg/dL — AB
Chloride: 102 mmol/L
Co2: 28 mmol/L
Creatinine: 1.04 mg/dL
EGFR (Non-African Amer.): >60
GLUCOSE: 101 mg/dL — AB
Potassium: 3.5 mmol/L
Sodium: 137 mmol/L

## 2015-03-02 LAB — CBC CANCER CENTER
BASOS PCT: 0.5 %
Basophil #: 0 x10 3/mm (ref 0.0–0.1)
Eosinophil #: 0.1 x10 3/mm (ref 0.0–0.7)
Eosinophil %: 1.1 %
HCT: 30.8 % — ABNORMAL LOW (ref 40.0–52.0)
HGB: 9.6 g/dL — ABNORMAL LOW (ref 13.0–18.0)
LYMPHS PCT: 13.6 %
Lymphocyte #: 1.3 x10 3/mm (ref 1.0–3.6)
MCH: 26.5 pg (ref 26.0–34.0)
MCHC: 31.1 g/dL — ABNORMAL LOW (ref 32.0–36.0)
MCV: 85 fL (ref 80–100)
MONOS PCT: 12.5 %
Monocyte #: 1.2 x10 3/mm — ABNORMAL HIGH (ref 0.2–1.0)
NEUTROS PCT: 72.3 %
Neutrophil #: 6.9 x10 3/mm — ABNORMAL HIGH (ref 1.4–6.5)
Platelet: 252 x10 3/mm (ref 150–440)
RBC: 3.61 10*6/uL — ABNORMAL LOW (ref 4.40–5.90)
RDW: 17.8 % — ABNORMAL HIGH (ref 11.5–14.5)
WBC: 9.5 x10 3/mm (ref 3.8–10.6)

## 2015-03-02 LAB — HEPATIC FUNCTION PANEL A (ARMC)
ALK PHOS: 194 U/L — AB
ALT: 19 U/L
Albumin: 2.7 g/dL — ABNORMAL LOW
BILIRUBIN DIRECT: 0.2 mg/dL
BILIRUBIN INDIRECT: 0.5
Bilirubin,Total: 0.7 mg/dL
SGOT(AST): 27 U/L
Total Protein: 7.2 g/dL

## 2015-03-09 ENCOUNTER — Ambulatory Visit
Admit: 2015-03-09 | Disposition: A | Discharge status: Home / Self Care | Payer: Self-pay | Attending: Internal Medicine | Admitting: Internal Medicine

## 2015-03-30 NOTE — Consult Note (Signed)
PATIENT NAME:  Marcus Haney, Marcus Haney MR#:  [id number] DATE OF BIRTH:  [birth year]  HEMATOLOGY CONSULTATION DATE OF CONSULTATION:  10/08/2013  CONSULTING PHYSICIAN:  Simonne Come. Ausar Georgiou, MD  HISTORY OF PRESENT ILLNESS: Mr. Marcus Haney is a 64 year old man who was admitted on 11/01, he is followed in the [facility] and is known to Dr. [name]. The patient's history is  significant for bladder cancer. He had superficial bladder cancer years ago and a cystectomy, in August 2013 he had multiple new hepatic lesions and other soft tissue masses. He had chemotherapy first in October 2013. More recently had a CT that showed progression of disease and he sought a second line palliative therapy on  MVAC treatment. He had his first dose on 09/25 and he treated at [facility] and then he comes to the [facility] for his Neulasta. His last treatment of chemotherapy was 10 days ago and Neulasta 9 days ago. His past history also includes prostate cancer, status post prostatectomy in 2001 and pulmonary embolism in December 2013 on anticoagulation. Was on Arixtra in the past. More recently on Coumadin. He has hypertension and arthritis.   FAMILY HISTORY: Included prostate cancer and also blood clotting, hypertension. No other malignancies.   SOCIAL HISTORY: Not a current smoker or a drinker.   ALLERGIES: GABAPENTIN AND LISINOPRIL AND SULFA DRUGS.   ADDITIONAL PAST MEDICAL HISTORY: He has had a history of anxiety, depression, and penile implant, appendectomy in the past. Hyperlipidemia.   MEDICATIONS AT THE TIME OF ADMISSION: Potassium 20 mEq b.i.d., Percocet 2 every six hours p.r.n. OxyContin 20 mg p.o. q.12 hours, Zofran 4 mg p.r.n., Macrobid 200 mg daily, Ativan 1 mg every six hours p.r.n. Cymbalta 60 mg daily, Coreg 6.25 mg p.o. b.i.d. It is incorrectly listed on his admission note that he is on Arixtra. He was prior on Arixtra, he confirms he is not taking that.  he is currently on Coumadin. The dose of  Coumadin was not recorded.   On that background the patient in the Emergency Room with weakness and shortness of breath, tachycardia and tachypnea. His INR was 3.5. He had some nausea and vomiting. He is watched on telemetry, IV fluids, potassium supplementation.  He is given intravenous antibiotics because of an abnormal urinalysis that shows urinary tract infection and sinus symptoms suggesting urosepsis. He has pancytopenia, again,  attributed to chemotherapy. No evidence of active bleeding. He has some azotemia, acute on chronic. His creatinine on admission was 2.51. His INR was 3.4.   His white count was 0.5 with 300 neutrophils, hemoglobin was 9.1, platelets 48,000.   PHYSICAL EXAMINATION:  GENERAL: On interview he is alert and cooperative in no acute distress. He was not having tachypnea. The patient states that he frequently gets short of breath, and he feels anxious, shaky and starts breathing hard due to anxiety episodes and sometimes that is the way he feels when he gets ill a week after chemotherapy. He does not have chest pain or any pleuritic discomfort. EYES: Sclerae was clear.  MOUTH: No thrush.  NECK: No mass.  VITAL SIGNS: Are stable.  LUNGS: Clear.  HEART: Regular.  ABDOMEN: Nontender. No palpable mass or organomegaly. Has a urostomy.  EXTREMITIES: No extremity edema.  NEUROLOGIC: Grossly nonfocal. Alert and cooperative   LABORATORY, DIAGNOSTIC, AND RADIOLOGICAL DATA: Already noted above plus a CT of the abdomen showed possible pyelonephritis with stranding in the left kidney and some hydronephrosis. He has a history of hydronephrosis in the past.  ADDITIONAL SYSTEM REVIEW: When I saw the patient, he was not having any headache or dizziness or visual disturbances. No ear or jaw pain. He was not coughing or wheezing. He denied hemoptysis. He denied any shortness of breath, which he did have outside of admission. No retrosternal chest pain. No palpitations. No abdominal pain, no  nausea, no vomiting, no diarrhea. No focal weakness. He denies back or bone pain. Mood and affect were calm.   IMPRESSION AND PLAN: The patient is receiving chemotherapy and MVAC program which has caused  cytopenia. He did receive Neulasta recently. He is still neutropenic and he has abnormal urinalysis,  but was not febrile. Nothing else acutely from hematology, but if he were to spike a temperature over 101, then the antibiotic coverage should be broadened for neutropenic coverage,  and he should be given Neupogen 480 mcg subcutaneously daily until the white count responds. Would transfuse platelets p.r.n. for bleeding or platelets less than 20,000 or falling. Nothing else acutely. We should check on his daily Coumadin dose. Coumadin was held the day of admission because of supratherapeutic INR, and get him back on his regular dose or slight dose adjustment quickly. Dr. [name], who knows the patient will follow him up on 11/03. Note that this patient was seen, examined and evaluated. I discussed the case with Dr. Doy Hutching on 11/01. This narrative was delayed until this current day.    ____________________________ Simonne Come. Inez Pilgrim, MD rgg:sg D: 10/09/2013 10:30:24 ET T: 10/09/2013 11:13:18 ET JOB#: [id number]  cc: Simonne Come. Inez Pilgrim, MD, <Dictator> Dallas Schimke MD ELECTRONICALLY SIGNED 10/26/2013 12:31

## 2015-03-30 NOTE — Consult Note (Signed)
ONCOLOGY followup - states that he feels better.  Denies vomiting.  Eating better today.No fevers. No bleeding symptoms.A, O x 3, NAD at rest          vitals - 97.8, 84, 20, 166/97, 98% on RA          lungs - b/l good air entry          abd - less distended, soft, nontender today. INR 4.0, Hb 8, platelets 51K, WBC 9200, 59% neutrophils. Metastatic bladder cancer on MVAC chemo at [facility] and gets Neulasta injection at our cancer center after each treatment, currently admitted with UTI, pancytopenia likely from chemo effect.  Clinically improving today. Agree with ongoing supportive care, antibiotics.  CBC today shows that neutropenia has resolved, will discontinue Neupogen. No major pain issues.  INR is still subtherapeutic, platelet count still low, Coumadin is on hold.  This would need to be closely monitored as outpatient if he is discharged soon, patient goes to [facility] for oncology care.  If unable to set up at [facility], please notify us and we can monitor labs locally as indicated.  Will continue to follow intermittently as indicated.  Electronic Signatures: Jonn Shingles (MD)  (Signed on 04-Nov-14 21:19)  Authored  Last Updated: 04-Nov-14 21:19 by Jonn Shingles (MD)

## 2015-03-30 NOTE — Discharge Summary (Signed)
PATIENT NAME:  Marcus Haney, Marcus Haney MR#:  [id number] DATE OF BIRTH:  05-Feb-1951  DATE OF ADMISSION:  10/08/2013 DATE OF DISCHARGE:  10/12/2013  PRESENTING COMPLAINT: Fever with vomiting.   DISCHARGE DIAGNOSES: 1.  Systemic antiinflammatory response syndrome secondary to Escherichia coli pyelonephritis/urinary tract infection.  2.  Metastatic bladder cancer.  3.  Chronic anticoagulation due to history of pulmonary embolus. 4.  Dehydration, resolved.   CODE STATUS: FULL CODE.   MEDICATIONS: 1.  Carvedilol 6.25 mg b.i.d.  2.  Lorazepam 1 mg q. 6 hours as needed.  3.  Zofran 4 mg q. 8 hours as needed.  4.  Oxycodone 5 mg 1 to 2 tablets every 3 hours as needed.  5.  Methadone 10 mg 3 times a day as needed for pain. 6.  Ceftin 500 mg b.i.d.  7.  Promethazine 12.5 mg p.o. q. 6 hours.   DISCHARGE DIET: Regular diet.   DISCHARGE FOLLOWUP:  1.  With Dr. Marcello Moores, oncology, tomorrow on your scheduled appointment at [facility].  2.  With Dr. Tomasita Morrow in 1 to 2 weeks.  LABORATORY AND DIAGNOSTICS: X-ray, AP abdomen: No bowel obstruction.   PT/INR is 37.6 and 4. The patient advised to resume Coumadin in 2 days and get INR checked in 2 to 3 days with primary care physician. INR on discharge is 4. White count is 8, hematocrit 23.5, white count 9.2 and platelet count 51. Creatinine 1.95, sodium 140 and glucose 81.   Chest x-ray: No acute cardiopulmonary disease.   White count on admission was 0.9, H and H 7.8 and 23.9.   Echo Doppler showed EF of 60% to 65%, normal left ventricular systolic function.   CT of the abdomen and pelvis showed findings concerning for underlying urinary tract infection, possible pyelonephritis of the left kidney.   Blood cultures negative in 5 days. Lactic acid 1.8 on admission. Creatinine on admission was 2.51. Urine culture was positive for E. coli, sensitive to nitrofurantoin, cefazolin, Rocephin, gentamicin, imipenem, Bactrim and ertapenem.   CONSULTANTS: Dr. [name].    BRIEF SUMMARY OF HOSPITAL COURSE: Marcus Haney is a 64 year old African American gentleman with history of bladder cancer who follows at [facility], oncology program, who comes to the Emergency Room after he started having fever and nausea and vomiting. The patient was found to have:  1.  SIRS due to acute left pyelonephritis. He was started on IV Rocephin. Blood cultures remained negative. Urine culture was positive for E. coli sensitive to Rocephin. The patient's antibiotics were changed to p.o. Ceftin to finish a 10 day course of treatment. His tachycardia improved and he remained afebrile.  2.  Pancytopenia, chemo induced. The patient currently is getting chemotherapy for his metastatic bladder cancer. Dr. Inez Pilgrim and Dr. [name] were consulted. They recommended Neulasta. The patient received 1 dose. Counts much improved after 1 dose of Neulasta.  3.  Nausea and vomiting, resolved.  4.  Acute renal failure. Baseline creatinine appears normal. He came in with creatinine of 2.51. IV fluids were given. The patient tolerated p.o. liquids. His creatinine came down to 1.95 prior to discharge.  5.  Hypertension. Resumed home meds.  6.  Hyperlipidemia. On statins.  7.  History of prostate cancer status post prostatectomy.  8.  History of PE, on Coumadin. The patient's INR went up from 3.4 to 4.7, came down to 4. He is asked to resume his Coumadin in 2 days and get PT/INR checked in the next 2 to 3 days  at the primary care physician's office.   Hospital stay otherwise remained stable. The patient remained a FULL CODE.   TIME SPENT: 40 minutes. ____________________________ Hart Rochester Posey Pronto, MD sap:sb D: 10/13/2013 15:56:57 ET T: 10/13/2013 16:33:01 ET JOB#: [id number]  cc: Siboney Requejo A. Posey Pronto, MD, <Dictator> Myrle Sheng. Jimmye Norman, MD Dr. [place]. Inez Pilgrim, MD Sandeep R. [name], MD Ilda Basset MD ELECTRONICALLY SIGNED 10/14/2013 14:25

## 2015-03-30 NOTE — H&P (Signed)
PATIENT NAME:  Marcus Haney, Marcus Haney MR#:  [id number] DATE OF BIRTH:  Jul 17, 1951  DATE OF ADMISSION:  10/08/2013  REFERRING PHYSICIAN: Dr. Cinda Quest.   FAMILY PHYSICIAN: Dr. Tomasita Morrow  REASON FOR ADMISSION: Urosepsis.   HISTORY OF PRESENT ILLNESS: The patient is a 64 year old male with a significant history of metastatic bladder cancer, followed at [facility]. The patient has a history of pulmonary embolism as well on anticoagulation. Presents to the Emergency Room today with shortness of breath and weakness. In the Emergency Room, the patient was found to be tachypneic and tachycardic. Initial labs revealed pancytopenia with a urinary tract infection. Chest x-ray was unremarkable. CT of the abdomen and pelvis suggested pyelonephritis. The patient's INR is therapeutic at 3.4 so the likelihood of PE is low. He is now admitted for further evaluation. He has had some nausea vomiting but no diarrhea. Denies chest pain.   PAST MEDICAL HISTORY: 1.  Metastatic bladder cancer to the liver.  2.  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  3.  History of pulmonary embolism, on anticoagulation.  4.  Benign hypertension.  5.  Hyperlipidemia.  6.  Prostate cancer, status post prostatectomy.  7.  Status post appendectomy.  8.  History of penile implant.  9.  Anxiety/depression.   MEDICATIONS: 1.  Klor-Con 20 mEq p.o. b.i.d.  2.  Percocet 5/325, two p.o. q.6 h. p.r.n.  3.  OxyContin 20 mg p.o. q. 12 hours.  4.  Zofran 4 mg p.o. every 6 hours p.r.n. nausea or vomiting.  5.  Macrobid 200 mg p.o. daily.  6.  Ativan 1 mg p.o. every 6 hours.  7.  Cymbalta 60 mg p.o. daily.  8.  Arixtra 7.5 mg subcutaneous daily.  9.  Coreg 6.25 mg p.o. daily b.i.d.   ALLERGIES: LISINOPRIL, SULFA, AND GABAPENTIN.   SOCIAL HISTORY: Negative for alcohol or tobacco abuse.   FAMILY HISTORY: Positive for diabetes, stroke, coronary artery disease and thyroid issues.   REVIEW OF SYSTEMS: CONSTITUTIONAL: No fever or change in weight.   EYES: No blurred or double vision. No glaucoma.  ENT: No tinnitus or hearing loss. No nasal discharge or bleeding. No difficulty swallowing.  RESPIRATORY: The patient has had cough, but no wheezing. Denies hemoptysis. No painful respiration.  CARDIOVASCULAR: No chest pain or orthopnea. No palpitations or syncope.  GASTROINTESTINAL: No diarrhea or abdominal pain.  GENITOURINARY: No dysuria or hematuria. No incontinence.  ENDOCRINE: No polyuria or polydipsia. No heat or cold intolerance.  HEMATOLOGIC: The patient admits to anemia but denies easy bruising or bleeding.  LYMPHATIC: No swollen glands.  MUSCULOSKELETAL: The patient denies pain in his neck, shoulders, knees or hips. Does have back pain. Denies gout.  NEUROLOGIC: No numbness or migraines. Denies stroke or seizures. Does have generalized weakness.  PSYCHIATRIC: The patient denies anxiety, insomnia or depression.   PHYSICAL EXAMINATION: GENERAL: The patient is chronically ill-appearing, somewhat anxious, but in no acute distress.  VITAL SIGNS: Currently remarkable for blood pressure of 125/65, with a heart rate of 115, respiratory rate of 20. He is afebrile.  HEENT: Normocephalic, atraumatic. Pupils equally round and reactive to light and accommodation. Extraocular movements are intact. Sclerae are anicteric. Conjunctivae are pale.  OROPHARYNX: Dry, but clear.  NECK: Supple without JVD. No adenopathy or thyromegaly is noted.  LUNGS: Clear to auscultation and percussion. No wheezes, rales, or rhonchi. No dullness. Respiratory effort is increased.  CARDIAC: There are rapid rate with a regular rhythm. Normal S1 and S2. No significant rubs, murmurs, or gallops. PMI  is nondisplaced. Chest wall is nontender.  ABDOMEN: Soft, nontender. Normoactive bowel sounds. No organomegaly, masses are appreciated. No hernias or bruits were noted. His urostomy was noted and appeared intact.  EXTREMITIES: Without clubbing, cyanosis or edema. Pulses were 2+  bilaterally.  SKIN: Warm and dry without rash or lesions.  NEUROLOGIC: Cranial nerves II through XII grossly intact. Deep tendon reflexes are symmetric. Motor and sensory exam is nonfocal.  PSYCHIATRIC: Revealed patient was alert and oriented to person, place, and time. He was cooperative and used good judgment.   LABORATORY AND IMAGING DATA: EKG revealed sinus tachycardia with no acute ischemic changes. Portable chest x-ray was unremarkable. CT scan of the abdomen revealed findings concerning for urinary tract infection and possible pyelonephritis of the left kidney. There was some hydronephrosis. Otherwise negative. His labs, his pro time was 44 with an INR of 3.4. White count was 0.5 with a hemoglobin of 9.1 and platelet count of 48,000. Glucose of 112 with a BUN of 30, creatinine of 2.51 with a GFR of 31, sodium of 134 and a potassium of 3.0. Urinalysis revealed 2+ blood with 2+ leukocyte esterase and 2+ bacteria. Lactic acid was 1.8.   ASSESSMENT: 1.  Urosepsis.  2.  Tachycardia.  3.  Pancytopenia.  4.  Tachypnea. 5.  Hypokalemia.  6.  Hyponatremia.  7.  Hyperglycemia.  8.  Acute on chronic renal failure.   PLAN: The patient will be admitted to the floor with telemetry as a FULL CODE. Blood and urine cultures have been sent. He will be started on IV fluids with potassium supplementation, as well as IV antibiotics. We will consult Oncology for his pancytopenia. Will hold his anticoagulation at this time as his INR is 3.4. We will follow-up pro time in the morning. Follow up chest x-ray in the morning. We will obtain an echocardiogram because of his tachypnea. Further treatment and evaluation will depend upon the patient's progress.   Total time spent on this patient: 50 minutes.   ____________________________ Leonie Douglas Doy Hutching, MD jds:dp D: 10/08/2013 11:30:00 ET T: 10/08/2013 11:42:51 ET JOB#: [id number]  cc: Leonie Douglas. Doy Hutching, MD, <Dictator> Myrle Sheng. Jimmye Norman, MD JEFFREY Lennice Sites  MD ELECTRONICALLY SIGNED 10/08/2013 12:15

## 2015-03-30 NOTE — Consult Note (Signed)
ONCOLOGY followup note - overall feels better. Had vomiting earlier today, currently feels better.No fevers. No major pain issues.A, O x 3, NAD at rest          vitals - 98.4, 95, 20, 159/97, 98% on RA          lungs - b/l good air entry          abd - mildly distended and tympanitic, vague mild tenderness in epigastrium INR 4.7. CBC from 11/2 - Hb 7.8, platelets 33K, WBC 900, ANC 500. Metastatic bladder cancer on MVAC chemo at [facility] and gets Neulasta injection at our cancer center after each treatment, currently admitted with UTI, pancytopenia likely from chemo effect. Agree with ongoing supportive care, antibiotics, repeat CBC/diff tomorrow. Monitor GI Sxs and if no improvement recommend CT scan evaluation. No major pain issues. Will continue to follow as indicated.   Electronic Signatures: Jonn Shingles (MD)  (Signed on 03-Nov-14 22:34)  Authored  Last Updated: 03-Nov-14 22:34 by Jonn Shingles (MD)

## 2015-03-31 NOTE — Consult Note (Signed)
Chief Complaint:  Subjective/Chief Complaint Pt denies abdominal pain, heartburn, nausea or vomiting.   VITAL SIGNS/ANCILLARY NOTES: **Vital Signs.:   06-Feb-15 08:00  Temperature Temperature (F) 97.5  Celsius 36.3  Temperature Source oral  Pulse Pulse 65  Respirations Respirations 18  Systolic BP Systolic BP 416  Diastolic BP (mmHg) Diastolic BP (mmHg) 95  Mean BP 116  Pulse Ox % Pulse Ox % 100  Oxygen Delivery Room Air/ 21 %   Brief Assessment:  GEN well developed, no acute distress, A/Ox3   Cardiac Irregular   Respiratory normal resp effort   Gastrointestinal Normal   Gastrointestinal details normal Soft  Nontender  Nondistended  No masses palpable  Bowel sounds normal  No rebound tenderness   EXTR negative edema   Additional Physical Exam Skin: warm, dry   Lab Results: Routine Chem:  06-Feb-15 03:40   Glucose, Serum  115  Creatinine (comp)  1.34  Sodium, Serum 136  Potassium, Serum 4.0  Chloride, Serum 106  CO2, Serum 28  Calcium (Total), Serum  8.4  Anion Gap  2  Osmolality (calc) 274  eGFR (African American) >60  eGFR (Non-African American)  57 (eGFR values <48m/min/1.73 m2 may be an indication of chronic kidney disease (CKD). Calculated eGFR is useful in patients with stable renal function. The eGFR calculation will not be reliable in acutely ill patients when serum creatinine is changing rapidly. It is not useful in  patients on dialysis. The eGFR calculation may not be applicable to patients at the low and high extremes of body sizes, pregnant women, and vegetarians.)  Magnesium, Serum  1.3 (1.8-2.4 THERAPEUTIC RANGE: 4-7 mg/dL TOXIC: > 10 mg/dL  -----------------------)  Routine Coag:  06-Feb-15 03:40   Prothrombin  25.1  INR 2.3 (INR reference interval applies to patients on anticoagulant therapy. A single INR therapeutic range for coumarins is not optimal for all indications; however, the suggested range for most indications is 2.0 -  3.0. Exceptions to the INR Reference Range may include: Prosthetic heart valves, acute myocardial infarction, prevention of myocardial infarction, and combinations of aspirin and anticoagulant. The need for a higher or lower target INR must be assessed individually. Reference: The Pharmacology and Management of the Vitamin K  antagonists: the seventh ACCP Conference on Antithrombotic and Thrombolytic Therapy. CSAYTK.1601Sept:126 (3suppl): [id number] A HCT value >55% may artifactually increase the PT.  In one study,  the increase was an average of 25%. Reference:  "Effect on Routine and Special Coagulation Testing Values of Citrate Anticoagulant Adjustment in Patients with High HCT Values." American Journal of Clinical Pathology 2006;126:400-405.)  Routine Hem:  06-Feb-15 03:40   WBC (CBC)  3.5  RBC (CBC)  2.90  Hemoglobin (CBC)  8.4  Hematocrit (CBC)  25.4  Platelet Count (CBC)  133  MCV 88  MCH 28.8  MCHC 32.9  RDW  18.8  Neutrophil % 62.0  Lymphocyte % 34.3  Monocyte % 2.6  Eosinophil % 0.5  Basophil % 0.6  Neutrophil # 2.2  Lymphocyte # 1.2  Monocyte #  0.1  Eosinophil # 0.0  Basophil # 0.0 (Result(s) reported on 13 Jan 2014 at 03:59AM.)   Assessment/Plan:  Assessment/Plan:  Assessment Severe erosive esophagitis:  Continue daily PPI Hematemesis: resolved.  May resume coumadin as pt high risk of embolic event, if rebleeds will need to reconsider risks/benefits.  Discussed with Dr PPosey Pronto   Plan 1) Daily PPI indefinitely 2) Advance diet as tolerated Please call if you have any questions or concerns  Electronic Signatures: Andria Meuse (NP)  (Signed 06-Feb-15 11:51)  Authored: Chief Complaint, VITAL SIGNS/ANCILLARY NOTES, Brief Assessment, Lab Results, Assessment/Plan   Last Updated: 06-Feb-15 11:51 by Andria Meuse (NP)

## 2015-03-31 NOTE — Discharge Summary (Signed)
PATIENT NAME:  Marcus Haney, Marcus Haney MR#:  [id number] DATE OF BIRTH:  1951-08-17  DATE OF ADMISSION:  01/12/2014 DATE OF DISCHARGE:  01/15/2014  DISCHARGE DIAGNOSES: 1.  Upper gastrointestinal bleed secondary to severe esophagitis, resolved.  2.  ESBL urinary tract infection.  3.  Metastatic bladder cancer status post urostomy.  4.  Hypertension.  5.  Hyperlipidemia.  6.  Pulmonary emboli.   DISCHARGE MEDICATIONS: 1.  Coreg 6.25 mg p.o. b.i.d.  2.  Ativan 1 mg every 6 hours for nausea and vomiting. 3.  Zofran 4 mg every 8 hours for nausea and vomiting. 4.  Phenergan 12.5 mg every 6 hours as needed.  5.  Magnesium oxide 400 mg p.o. t.i.d.  6.  KCL 10 mEq p.o. b.i.d.  7.  Coumadin 5 mg 1-1/2 tablets on Tuesday, Wednesday, Thursday, and Saturday and 1 tablet on Monday, Friday, and Sunday. 8.  Diovan 80 mg p.o. daily. 9.  Simvastatin 40 mg p.o. daily. 10.  Decadron 4 mg 2 tablets that is 8 mg at night and 8 mg in the morning on the day of chemotherapy.  11.  Methadone 10 mg p.o. t.i.d. as needed for pain.  12.  Oxycodone 5 mg 1 to 2 tablets every 3 hours as needed for pain.  13.  Ertapenem 1 gram IV q. 24 hours for 10 days. The patient has a Port-A-Cath.  14.  Protonix 40 mg p.o. b.i.d.; this is new.  DISCHARGE DIET: Low sodium, low-fat.  DISCHARGE ANTIBIOTICS:  Discharged home with IV antibiotics for ESBL UTI. Arranged home health RN.  CONSULTATIONS: Oncology with Dr. Leia Alf and gastroenterology with Dr. Candace Cruise.  PROCEDURES: EGD: Showed severe esophagitis which is grade D esophagitis with no bleeding, small hiatal hernia, normal stomach, normal duodenum.  LABORATORY DATA: On admission, sodium is 140, potassium 3.6, chloride 106, bicarb 28, BUN 27, , and glucose 103. WBC showed 8.9, hemoglobin 8.7, hematocrit 26.7, and platelets 162. The patient's INR at the time of discharge 1.9. Hemoglobin on February 7th 9.2.   HOSPITAL COURSE:  1.  The patient is a 64 year old male with history of  bladder cancer status post urostomy, history of PE and on Coumadin, hypertension, and hyperlipidemia came in because of coffee-ground vomiting. Look at the history and physical for full details. The patient admitted for GI bleed. Did not have any abdominal pain. The patient's vitals were stable on admission and hemoglobin on admission was 8.7. He was started on IV Protonix drip and GI was consulted. The patient had an EGD which showed severe esophagitis. He was on Protonix drip for 3 days then changed to Protonix 40 q. 12 IV, and the patient initially was n.p.o. and then started on clear liquids. The patient tolerated the diet. Did not have any further hematemesis. The patient did not have coffee-ground vomiting after EGD so GI recommended continuing Protonix and follow up with them as an outpatient. We restarted the Coumadin because of his PE. The patient's hemoglobin stayed stable and did not have any further coffee-ground vomiting. The patient is discharged home with Protonix 40 mg p.o. b.i.d.  2.  ESBL UTI. The patient did have urostomy bag and urine culture showed ESBL. He  had recurrent UTIs, and he has a urologist in [facility]. The patient is discharged with IV ertapenem for 10 days. The patient was arranged with home health. 3.  History of metastatic bladder cancer, status post urostomy. The patient was seen by Dr. Leia Alf. He will follow up with  him regarding palliative chemotherapy with Taxol and carboplatin. The patient's Coumadin was on hold when he came with coffee-ground vomiting then after the EGD the patient did not have any ulcer. The patient restarted back on Coumadin and Protonix and hemoglobin stayed stable and did not require any transfusion. The patient's GI bleed is thought to be secondary to severe esophagitis and discharged home with PPIs indefinitely. The patient advised to follow up with his cancer doctor and also GI as an outpatient.   TIME SPENT: More than 30 minutes on  discharge preparation. ____________________________ Epifanio Lesches, MD sk:sb D: 01/16/2014 12:35:25 ET T: 01/16/2014 16:11:04 ET JOB#: [id number]  cc: Epifanio Lesches, MD, <Dictator> Epifanio Lesches MD ELECTRONICALLY SIGNED 01/17/2014 16:02

## 2015-03-31 NOTE — H&P (Signed)
PATIENT NAME:  Marcus Haney, Marcus Haney MR#:  [id number] DATE OF BIRTH:  [birth year]  DATE OF ADMISSION:  02/27/2014  PRIMARY CARE PHYSICIAN: Marcus Haney. Marcus Norman, MD, at [facility].   PRIMARY ONCOLOGIST: Marcus Haney. [name], MD  CHIEF COMPLAINT: Hypotension and weakness.   HISTORY OF PRESENT ILLNESS:  A very pleasant 64 year old male with a history of bladder cancer, severe esophagitis diagnosed in February 2015, on chronic anticoagulation for history of PE in 2013, who was seen by his primary care physician this morning when his blood pressure was noted to be 71/58. He was also complaining of lightheaded and weakness over the past 2 days. He also complains of hematemesis. Denies any melena or hematochezia. No abdominal pain. He also noted that in his urostomy bag there is hematuria. His INR level at the doctor's office was 5.4.   REVIEW OF SYSTEMS: CONSTITUTIONAL: No fever. Positive fatigue, weakness.  EYES: No blurred or double vision.  ENT:  No  ear pain, hearing loss, snoring, postnasal drip.  RESPIRATORY: No cough, wheezing, hemoptysis, dyspnea.  CARDIOVASCULAR: No chest pain, palpitations, orthopnea, syncope, edema, arrhythmia, or dyspnea on exertion.  GASTROINTESTINAL:  Positive mild nausea. No vomiting or diarrhea. No abdominal pain. No melena. Positive esophagitis. Positive hematemesis and coffee-ground emesis.  GENITOURINARY:  The patient has hematuria in his urostomy bag.  ENDOCRINE: No polyuria or polydipsia.  HEMATOLOGIC AND LYMPHATIC:  Positive anemia. No easy bruising.  SKIN: No skin rashes or lesions.  MUSCULOSKELETAL:positive for arthritis NEUROLOGIC: No history of CVA or TIA.   PSYCHIATRIC:  There is some anxiety, depression.   PAST MEDICAL HISTORY:   1.  Metastatic bladder cancer, status post bladder removal with metastatic disease to the liver, status post urostomy.  2.  History of esophagitis seen on EGD in February 2014.  3.  Hypertension.  4.  Hyperlipidemia.   5.  Arthritis.  6.  History of prostate cancer with prostatectomy in 2001.  7.  History of PE in December 2013 on life [place] anticoagulation.  8.  Anxiety or depression.   FAMILY HISTORY: Positive for diabetes, stroke and coronary artery disease.   SOCIAL HISTORY: No tobacco, alcohol or IV drug use. He lives alone.   ALLERGIES: GABAPENTIN, LISINOPRIL AND SULFA.   PAST SURGICAL HISTORY: 1.  Appendectomy.  2.  Penile implant. 3.  Prostatectomy.  4.  Bladder removal.  5.  Urostomy placement.   MEDICATIONS: 1.  Coreg 6.25 b.i.d.  2.  Dexamethasone 4 mg 2 tablets at bedtime the night before chemotherapy and 2 tablets in the morning the day of chemotherapy.  3.  Ativan 1 mg q.6 hours p.r.n. nausea and vomiting.  4.  Magnesium oxide 400 mg t.i.d.  5.  Methadone 10 mg t.i.d. p.r.n.  6.  Zofran 4 mg q.8 hours p.r.n. nausea and vomiting.  7.  Oxycodone 5 mg 1 to 2 tablets q.3 hours p.r.n.  8.  KCl 10 mEq t.i.d.  9.  Promethazine 12.5 mg q.6 hours p.r.n.  10.  Protonix 40 mg b.i.d. which he ran out of about 3 days ago.  11.  Simvastatin 40 mg daily.  12.  Coumadin 5 mg at bedtime Sunday and Monday.  13.  Coumadin 7.5 mg at bedtime except on Sunday and Monday.   PHYSICAL EXAMINATION: VITAL SIGNS: Temperature 97.7, pulse 88, respirations 18, blood pressure 114/78, 100% on room air.  GENERAL: The patient is alert, oriented, not in acute distress.  HEENT: Head is atraumatic. Pupils are round and reactive. Sclerae  anicteric. Mucous membranes are moist. Oropharynx is clear. NECK: Supple without JVD, carotid bruit or enlarged thyroid.  CARDIOVASCULAR: Regular rate and rhythm. No murmurs, gallops or rubs. PMI is not displaced.  LUNGS: Clear to auscultation without crackles, rales, rhonchi or wheezing. Normal percussion.  BACK:  No CVA or vertebral tenderness.   ABDOMEN: The patient has got a large scar in his abdomen and he has a urostomy bag. Bowel sounds are positive. Nontender,  nondistended. No hepatosplenomegaly.  EXTREMITIES: No clubbing, cyanosis or edema.   NEURO:  Cranial nerves II through XII are grossly intact. There are no focal deficits.  SKIN: The patient has a Port-A-Cath on the right side. No rash or lesions.  MUSCULOSKELETAL: The patient is able to move all extremities.   LABORATORY, DIAGNOSTIC AND RADIOLOGICAL DATA:  1.  Total protein 7.7, albumin 3.6, bilirubin 3.5, alk phos 147, ALT is 36, AST 24.  2.  Troponin less than 0.02.  3.  White blood cells 4.5, hemoglobin 9.5, hematocrit 31, platelets are 135.  4.  Sodium 136, potassium 3.8, chloride 103, bicarb 27, BUN 25, creatinine 1.46, glucose is 100. Lipase is 65.  5.  INR is 5.5.  6.  Chest x-ray shows no acute cardiopulmonary disease.  7.  Abdominal x-ray is negative for any abdominal pathology.  8.  EKG shows normal sinus rhythm. No ST elevations or depression.   ASSESSMENT AND PLAN: This is a very pleasant 62-year-old male with a history of pulmonary embolism, on lifelong anticoagulation, bladder cancer, currently undergoing chemotherapy and severe esophagitis seen on an esophagogastroduodenoscopy in February 2015, who presents with weakness, hypotension at the doctor's office but no episodes of hypotension here in the ER with dizziness, nausea and hematemesis and coffee-ground emesis.  1.  Gastrointestinal bleed, likely secondary to Coumadin, possible severe esophagitis. The patient will need evaluation by gastroenterology.  Gastroenterology consultation has been requested. The patient is on Protonix 40 IV q.12 hours. Obviously, we are holding Coumadin or any nonsteroidal anti-inflammatory drugs. He will be on a soft diet for now, n.p.o. after midnight for possible procedure in the a.m.  2.  Coagulopathy with GIBL The patient is on lifelong Coumadin for his history of pulmonary embolism and bladder cancer.  INR was 5.5. I ordered 2.5 mg of vitamin K. We will repeat INR tomorrow. Depending on the INR,  gastrointestinal procedure may be planned for the a.m.  3.  Hypertension. The patient was hypotensive at the doctor's office; however, he is not hypotensive since he has reached the ER. We will continue Coreg with some parameters, monitoring blood pressure closely.  4.  History of hyperlipidemia. Continue statin medication.  5.  History of bladder cancer, currently undergoing chemotherapy. The patient has a Port-A-Cath. If needed, we can consult Dr. Pandit.  He has hematurai currently which i believe to be from the elevated INR. We will need to monitor this daily and if needed consult urology. 6.  The patient is a limited CODE STATUS. He does not want intubation but okay to do cardiopulmonary resuscitation and vasoactive medications as well as defibrillation.   TIME SPENT: Approximately 45 minutes.     ____________________________ Sital P. Mody, MD spm:cs D: 02/27/2014 13:05:36 ET T: 02/27/2014 14:31:57 ET JOB#: [id number]  cc: Sital P. Mody, MD, <Dictator> [facility] Sandeep R. Pandit, MD SITAL P MODY MD ELECTRONICALLY SIGNED 02/27/2014 15:42 

## 2015-03-31 NOTE — Consult Note (Signed)
PATIENT NAME:  Marcus Haney, Marcus Haney MR#:  [id number] DATE OF BIRTH:  06-Sep-1951  DATE OF CONSULTATION:  02/28/2014  REFERRING PHYSICIAN:  Sital P. Benjie Karvonen, MD CONSULTING PHYSICIAN:  Corky Sox. Zettie Pho, PA-C  DICTATING FOR: Arther Dames, MD  REASON FOR CONSULTATION: Hematemesis with recent diagnosis of esophagitis.   HISTORY OF PRESENT ILLNESS: This is a pleasant 64 year old gentleman who presented to the hospital with concerns of hematemesis. He underwent an EGD in February 2015 notable for severe esophagitis and was given PPI 40 mg to be taken twice a day. He actually ran out of this medication about 4 to 5 days ago and began experiencing quite a bit of vomiting. He has noticed a dark-colored blood appearance in this vomit. Since being admitted, he was restarted on Protonix 40 mg twice a day given IV. He actually has not had any further vomiting last night or at all today. He tolerated his diet last night and his breakfast this morning without any nausea, vomiting, dysphagia, indigestion, heartburn or discomfort. There is no painful swallowing. He has not had any further emesis. When he first was admitted, his hemoglobin was 9.5, and it has steadily declined to 7.9. His MCV is 88. He is denying any black or bloody stools. There is no chest pain or shortness of breath. There is no lightheadedness, dizziness or abdominal pain associated with this. Of note, he does take Coumadin for a history of a PE, and this has been held since he has been admitted. His INR has been quite elevated, most recently was 5.5, and he was given vitamin K, but unfortunately, it did not do much overnight to improve the INR reading. No unintentional weight changes. No other symptomatic concerns or complaints at the present time. The patient feels significantly better today.   PAST MEDICAL HISTORY: Metastatic bladder cancer with spread to the liver, history of prostate cancer, reflux esophagitis, history of a pulmonary embolism, hypertension,  dyslipidemia and arthritis.   PAST SURGICAL HISTORY: Prostatectomy, bladder removal, urostomy bag placement, appendectomy, penile implant.   ALLERGIES: GABAPENTIN, LISINOPRIL AND SULFA.   MEDICATIONS: Ativan, magnesium oxide, methadone, Coreg, dexamethasone, Zofran, oxycodone, promethazine, potassium chloride, simvastatin, Coumadin, Protonix.   REVIEW OF SYSTEMS: A 10-system review of systems  was obtained on the patient. Pertinent positives are mentioned above and otherwise negative.   OBJECTIVE:  VITAL SIGNS: Blood pressure 138/80, temperature 98.9, heart rate 71, respirations 20, bedside pulse oximetry is 100%.  GENERAL: This is a pleasant 64 year old gentleman resting quietly and comfortably in bed in no acute distress. I do notice that he has persistent hiccups throughout our discussion, and he reports that he has had this problem ever since he started chemotherapy.  HEENT: Normocephalic/atraumatic. Extraocular movements are intact. Anicteric. NECK: Soft, supple. JVP appears normal. No adenopathy. CHEST: Clear to auscultation. No wheeze or crackle. Respirations unlabored. HEART: Regular. No murmur, rub, or gallop.  Normal S1 and S2. ABDOMEN: Soft, nontender, nondistended.  Normal active bowel sounds in all four quadrants.  No organomegaly. No masses EXTREMITIES: No swelling, well perfused. SKIN: No rash or lesion. Skin color, texture, turgor normal. NEUROLOGICAL: Grossly intact. PSYCHIATRIC: Normal tone and affect. MUSCULOSKELETAL: No joint swelling or erythema.   FAMILY HISTORY: There is no known family history of GI malignancy, colon polyps or IBD.   LABORATORY DATA: White blood cells 2.3, hemoglobin 7.9, down from 9.5, hematocrit 24.3, platelets 91. Sodium 140, potassium 3.9, BUN 23, creatinine 1.34, glucose 87. INR 5.5, PT 48.1, MCV 88. Lipase 55.  IMAGING: A 3-way abdominal x-ray plus chest x-ray were obtained on the patient. This was negative for acute cardiopulmonary disease  and was negative abdominal film as well.   ASSESSMENT:  1. Hematemesis.  2. Severe reflux esophagitis noted on esophagogastroduodenoscopy last month.  3. Anemia, likely secondary to blood loss.  4. History of pulmonary embolism, currently on Coumadin. He has a supratherapeutic INR of 5.5. Coumadin is currently being held.  5. History of metastatic bladder cancer with known spread to the liver.   PLAN: I have discussed this patient's case in detail with Dr. Arther Dames, who is involved in the development of the patient's plan of care. At this time, because he did recently have an EGD in February 2015, we are quite confident on the source of the upper GI bleed. Certainly, with his supratherapeutic INR and absence of ongoing vomiting today, there would be no current indication to repeat his EGD at this time. We do recommend gradually advancing his diet, continuing Protonix 40 mg twice a day and checking serial hemoglobins throughout the remainder of his hospital stay because this had been steadily declining. Should he remain stable through the night, he may be cleared for discharge, but regardless, we will continue to monitor this patient throughout hospitalization and make further recommendations per clinical course. All questions were answered.   Thank you so much for this consultation and for allowing [place] to participate in the patient's plan of care.   ATTENDING GASTROENTEROLOGIST: Arther Dames, MD   ____________________________ Corky Sox. Valmai Vandenberghe, PA-C kme:lb D: 02/28/2014 13:43:54 ET T: 02/28/2014 13:56:22 ET JOB#: [id number]  cc: Corky Sox. Cassey Hurrell, PA-C, <Dictator> [place] PA ELECTRONICALLY SIGNED 02/28/2014 16:09

## 2015-03-31 NOTE — Discharge Summary (Signed)
Dates of Admission and Diagnosis:  Date of Admission 27-Feb-2014   Date of Discharge 01-Mar-2014   Admitting Diagnosis GIB   Final Diagnosis GIB with severe esophagitis diagnosed previously on recent EGD 12/2013   Discharge Diagnosis 1 bladder cancer undergoing chemo with hematuria due to coagulopathy   2 coagulopathy INR at discharge 3.0    Chief Complaint/History of Present Illness A very pleasant 64 year old male with a history of bladder cancer, severe esophagitis diagnosed in February 2015, on chronic anticoagulation for history of PE in 2013, who was seen by his primary care physician this morning when his blood pressure was noted to be 71/58. He was also complaining of lightheaded and weakness over the past 2 days. He also complains of hematemesis. Denies any melena or hematochezia. No abdominal pain. He also noted that in his urostomy bag there is hematuria. His INR level at the doctor's office was 5.4.   Allergies:  Gabapentin: GI Distress  Lisinopril: N/V/Diarrhea, Dizzy/Fainting  Sulfa drugs: Unknown  Pertinent Past History:  Pertinent Past History 1.  Metastatic bladder cancer, status post bladder removal with metastatic disease to the liver, status post urostomy.  2.  History of esophagitis seen on EGD in February 2014.  3.  Hypertension.  4.  Hyperlipidemia.  5.  Arthritis.  6.  History of prostate cancer with prostatectomy in 2001.  7.  History of PE in December 2013 on life [place] anticoagulation.  8.  Anxiety or depression.   Hospital Course:  Hospital Course 1.coffee ground emesis likely due secondary to Coumadin and severe esophagitis.   was consulted, since he had a recent EGD in January showing severe esophagitis and he had no emesis while in the hospital they felt this should be treatted conservitely. Dr Rayann Heman recommended continuing on protonix 40 mg BID for 30 days then daily.  He is tolerating his soft diet. He did have a drop in hemoglobin, which was likley  dilutional. He received one unit of blood priro to discharge and is referred to his PCP for repeat hemoglobin on Friday.H   2.  Coagulopathy with GI Bleeding He is on lifelong Coumadin for his history of pulmonary embolism and bladder cancer. -INR was 5.5 on admission. His discharge INR is 3.0.We have held his coumadin until follow up with his PCP. As per GI consultaion it is okay for the patient to go back on coumadin. IT is being held today due to INR 3.0.   3. Hypertension; Apparently he was hypotensive at the doctor's office but his blood pressure has been stable in the hospital. He will continue Coreg    4.  History of hyperlipidemia. Continue statin medication.   5.  History of bladder cancer, currently undergoing chemotherapy. He has some mild hematuria.   Condition on Discharge Stable   Code Status:  Code Status Limited Code   CPR? Yes   Intubation? No   Mechanical Ventilation? No   Anti-Arrythmic Drugs? Yes   Vasoactive Drugs? Yes   Defibrillation/Cardioversion? Yes   DISCHARGE INSTRUCTIONS HOME MEDS:  Medication Reconciliation: Patient's Home Medications at Discharge:     Medication Instructions  carvedilol 6.25 mg oral tablet  1 tab(s) orally 2 times a day    lorazepam 1 mg oral tablet  1 tab(s) orally every 6 hours, As Needed - for Nausea, Vomiting   ondansetron 4 mg oral tablet  1 tab(s) orally every 8 hours, As Needed - for Nausea, Vomiting   promethazine 12.5 mg oral tablet  1 tab(s) orally  every 6 hours, As Needed - for Nausea, Vomiting   magnesium oxide 400 mg oral tablet  1 tab(s) orally 3 times a day   potassium chloride 10 meq oral tablet, extended release  1 tab(s) orally 2 times a day   simvastatin 40 mg oral tablet  1 tab(s) orally once a day (at bedtime)   oxycodone 5 mg oral tablet  1-2 tab(s) orally every 3 hours as needed for pain   methadone 10 mg oral tablet  1 tab(s) orally 3 times a day as needed for pain   dexamethasone 4 mg oral tablet  2  tab(s) orally once a day (at bedtime) the night before chemotherapy and 2 tablets once a day (in the morning) the day of chemotherapy   protonix 40 mg oral delayed release tablet  1 tab(s) orally 2 times a day   warfarin 5 mg oral tablet  1 tab(s) orally once a day (at bedtime) on Sunday and Monday   warfarin 7.5 mg oral tablet  1 tab(s) orally once a day (at bedtime) except on Sunday and Mondaydo not take coumadin on 3/25 and 3/26 may resume after your visit with your PCP on friday 03/03/14 thank you     Physician's Instructions:  Home Health? No   Treatments None   Home Oxygen? No   Diet Low Sodium   Activity Limitations None   Return to Work Not Applicable   Time frame for Follow Up Appointment 1-2 days  friday got hgb and INR   Electronic Signatures: Bettey Costa (MD)  (Signed 25-Mar-15 19:38)  Authored: ADMISSION DATE AND DIAGNOSIS, CHIEF COMPLAINT/HPI, Allergies, PERTINENT PAST HISTORY, HOSPITAL COURSE, DISCHARGE INSTRUCTIONS HOME MEDS, PATIENT INSTRUCTIONS   Last Updated: 25-Mar-15 19:38 by Bettey Costa (MD)

## 2015-03-31 NOTE — Consult Note (Signed)
PATIENT NAME:  Marcus Haney, Marcus Haney MR#:  [id number] DATE OF BIRTH:  [birth year]  DATE OF CONSULTATION:  12/04/2014  REFERRING PHYSICIAN:  Theodoro Grist, MD   CONSULTING PHYSICIAN:  Janai Maudlin R. [name], MD  ONCOLOGY CONSULTATION:  REASON FOR CONSULTATION:  Pancytopenia, chemotherapy for bladder cancer December 21.   HISTORY OF PRESENT ILLNESS:  The patient is a 64 year old gentleman with known history of stage IV metastatic bladder cancer, more recently on third-line palliative chemotherapy with Alimta with recent CT scan suggestive of response to treatment, hypertension, PE, arthritis, and prostate cancer status post prostatectomy in 2001, who had decreased oral intake and intractable nausea and vomiting for the last few days. He developed generalized weakness and mild light-headedness on getting up and ambulating. He came to the Emergency Room and was admitted to hospital. He is currently on IV fluids and states that he is beginning to feel better. Lower abdominal pain is under better control. No fevers. Denies any dyspnea, cough, or bleeding symptoms. Most recent chemotherapy was on December 21 with Alimta. CBC today shows WBC of 1700, hemoglobin 9.0, platelets 107,000, and ANC of 900.    PAST MEDICAL HISTORY AND SURGICAL HISTORY:  As in the HPI above.   FAMILY HISTORY:  Noncontributory.   SOCIAL HISTORY:  Denies any ongoing smoking, ex-smoker. No history of alcohol or recreational drug usage.   ALLERGIES:  INCLUDE LISINOPRIL, SULFA, AND GABAPENTIN.   HOME MEDICATIONS:  Oxycodone 5 to 10 mg q. 4 hours p.r.n. for pain, methadone 10 mg t.i.d., potassium chloride 10 mEq daily, Zofran 4 mg q. 8 hours p.r.n., folic acid 1 mg daily, pantoprazole 40 mg daily, Coreg 6.25 mg daily, simvastatin 40 mg daily.  REVIEW OF SYSTEMS: CONSTITUTIONAL:  As in the HPI. Currently no fevers.  HEENT:  Denies any headache or dizziness at rest. No epistaxis, ear or jaw pain.   CARDIAC:  No angina, palpitations, or  orthopnea.  LUNGS:  As in the HPI.  GASTROINTESTINAL:  Nausea and vomiting, improving today. No blood in stools. Abdominal pain better today.  GENITOURINARY:  No dysuria or hematuria.  MUSCULOSKELETAL:  No new bone pains otherwise.  SKIN:  No new rashes or pruritus.  NEUROLOGIC:  No new focal weakness, seizures, or loss of consciousness.  ENDOCRINE:  No polyuria or polydipsia. Eating better today.   PHYSICAL EXAMINATION: GENERAL:  The patient is sitting in bed, alert and oriented, in no acute distress, and converses appropriately.  VITAL SIGNS:  Temperature 97.6, pulse 60, respirations 18, blood pressure 148/87, oxygen saturation 100% on room air.  HEENT:  Normocephalic, atraumatic. Extraocular movements are intact. Sclerae are anicteric.  NECK:  Negative for lymphadenopathy.  CARDIOVASCULAR:  S1 and S2, regular rate.  LUNGS:  Show bilateral good air entry. No rhonchi.  ABDOMEN:  Soft. Mild tenderness in lower quadrants, but no guarding or rigidity. Bowel sounds present.  EXTREMITIES:  No edema.  NEUROLOGIC:  Cranial nerves seem intact. Grossly nonfocal.  SKIN:  No new rashes or major bruising.   LABORATORY RESULTS:  WBC 1700, ANC 900, hemoglobin 9.0, platelets 107,000. Creatinine 1.17. INR 2.0.   IMPRESSION AND RECOMMENDATION:  A 64 year old gentleman with known history of metastatic stage IV bladder cancer on palliative treatment with Alimta chemotherapy with most recent CT scan suggestive of response to treatment. The patient was admitted with nausea, vomiting, dehydration, and weakness. Today, he is feeling better, beginning to eat better, and nausea is improving. Pain is under control. Agree with ongoing supportive treatment with IV fluids.  He does have mild pancytopenia likely secondary to myelosuppressive effect of chemotherapy. ANC is slightly low at 900 but does not need any further intervention at this time. Continue to monitor during hospitalization. There is mild thrombocytopenia  with no bleeding issues. The patient is discharged to home. He was advised to keep previously scheduled appointments at the cancer center the same. Oncology will continue to follow during hospitalization as indicated.  Thank you for the referral, please feel free to contact me if any additional questions.    ____________________________ [name] [name], MD srp:nb D: 12/05/2014 00:30:53 ET T: 12/05/2014 01:01:29 ET JOB#: [id number]  cc: Maryela Tapper R. [name], MD, <Dictator> Alveta Heimlich MD ELECTRONICALLY SIGNED 12/05/2014 9:31

## 2015-03-31 NOTE — H&P (Signed)
PATIENT NAME:  Marcus Haney, BRIGHT MR#:  [id number] DATE OF BIRTH:  03-Dec-1951  DATE OF ADMISSION:  01/12/2014  PRIMARY CARE PHYSICIAN: Myrle Sheng. Jimmye Norman, MD  ONCOLOGIST: [name]. [name], MD  REFERRING PHYSICIAN: Connye Burkitt. Lovena Le, MD  CHIEF COMPLAINT: Coffee-ground emesis.   HISTORY OF PRESENT ILLNESS: The patient is a 64 year old African-American male with a past medical history of metastatic bladder cancer, ongoing chemotherapy, status post bladder removal and urostomy, chronic history of pulmonary embolism, on Coumadin, hyperlipidemia and hypertension, who is presenting to the ER with a chief complaint of coffee-ground emesis since yesterday. The patient is reporting that he has been following up with Dr. [name] on a regular basis, and his last chemotherapy was last Tuesday. He was in his usual state of health until yesterday, and then he started vomiting coffee-ground vomit since yesterday. This is not associated with abdominal pain, melena or lower GI bleed. He has chronic hiccups since he is on chemotherapy. Yesterday, he had 7 to 8 episodes of upper GI bleed. Denies any abdominal pain. Feeling lightheaded, but denies any chest pain, shortness of breath or loss of consciousness. Denies any abdominal pain. No similar complaints in the past. In the ER, the patient's INR is therapeutic at 2.1. Last Coumadin intake was yesterday evening. No other complaints. No family members at bedside.   PAST MEDICAL HISTORY:  1. Metastatic bladder cancer status post bladder removal, metastasis to liver, status post urostomy.  2. 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  3. History of pulmonary embolism, on anticoagulation.  4. Benign hypertension.  5. Hyperlipidemia.  6. Prostate cancer, status post prostatectomy.   7. Anxiety and depression.   PAST SURGICAL HISTORY:  1. Appendectomy. 2. Penile implant. 3. Prostatectomy.  4. Bladder removal. 5. Urostomy placement.  ALLERGIES: THE PATIENT IS ALLERGIC TO  GABAPENTIN, LISINOPRIL, SULFA DRUGS.   PSYCHOSOCIAL HISTORY: Lives at home, lives alone. No history of smoking, alcohol or illicit drug usage.   FAMILY HISTORY: Positive for diabetes, stroke and coronary artery disease.   HOME MEDICATIONS:  1. Coumadin 5 mg 1-1/2 tablet once daily on Tuesday, Wednesday, Thursday, Saturday, 5 mg once daily on Monday, Friday and Saturday. 2. Simvastatin 40 mg p.o. at bedtime. 3. Promethazine 12.5 mg p.o. q.6 hours.  4. Potassium chloride 1 tablet 10 mEq p.o. 2 times a day.  5. Oxycodone 5 mg 1 to 2 tablets p.o. every 3 hours as needed for pain.  6. Ondansetron 4 mg p.o. q.8 hours.  7. Methadone 10 mg p.o. 3 times a day. 8. Magnesium oxide 400 mg 3 times a day. 9. Lorazepam 1 mg p.o. every 6 hours as needed. 10. Diovan 80 mg p.o. once daily. 11. Dexamethasone 4 mg 2 tablets p.o. at 10:00 p.m. the night before and orally at 6:00 a.m. on the day of chemotherapy.  12. Coreg 6.25 mg p.o. b.i.d.   REVIEW OF SYSTEMS: CONSTITUTIONAL: Denies any fever. Complaining of chronic fatigue and weakness.  EYES: Denies blurry vision, double vision.  ENT: Denies epistaxis, discharge. Has been having chronic hiccups.  RESPIRATION: Denies cough, COPD.  CARDIOVASCULAR: No chest pain, palpitations.  GASTROINTESTINAL: Complaining of hematemesis, coffee-ground emesis. Denies diarrhea, abdominal pain. No melena.  GENITOURINARY: No dysuria, hematuria.  ENDOCRINE: Denies polyuria, nocturia, thyroid problems. HEMATOLOGIC AND LYMPHATIC: Complaining of hematemesis. INTEGUMENTARY: No acne, rash, lesions.  MUSCULOSKELETAL: No joint pain in the neck and back. Denies gout. NEUROLOGIC: No vertigo or ataxia.  PSYCHIATRIC: No ADD, OCD.   PHYSICAL EXAMINATION:  VITAL SIGNS: Temperature 98.3,  pulse 69, respirations 20, blood pressure 172/96, pulse oximetry 96% on 2 liters.  GENERAL APPEARANCE: Not under acute distress. Moderately built and nourished.  HEENT: Normocephalic, atraumatic.  Pupils are equally reactive to light and accommodation. No scleral icterus. No conjunctival injection. No sinus tenderness. No postnasal drip.  NECK: Supple. No JVD. No thyromegaly. Range of motion is intact.  LUNGS: Clear to auscultation bilaterally. No accessory muscle usage. No anterior chest wall tenderness on palpation.  CARDIAC: S1 and S2 normal. Regular rate and rhythm. No murmurs.  GASTROINTESTINAL: Soft. Bowel sounds are positive in all 4 quadrants. Nontender, nondistended. No hepatosplenomegaly. Midline incision is healing well. Right lower quadrant has urostomy tube with clear urine. No masses felt. NEUROLOGIC: Awake, alert and oriented x3. Cranial nerves II through XII are intact. Motor and sensory are intact. Reflexes are 2+.  EXTREMITIES: No edema. No cyanosis. No clubbing.  SKIN: Warm to touch. Normal turgor. No rashes. No lesions.  MUSCULOSKELETAL: No joint effusion, tenderness, erythema.  PSYCHIATRIC: Normal mood and affect.   LABORATORIES AND IMAGING STUDIES: LFTs are normal except albumin at 3.1. CBC: WBC 8.9, hemoglobin 8.7, hematocrit 26.7, platelets are 162. PT 23.4, INR 2.1, PTT 37.7. Urinalysis: Yellow in color, hazy in appearance, ketones negative, nitrite negative, leukocyte esterase 3+. According to the urinalysis on February 3rd, budding yeast is present, bacteria 3+. Glucose 103, BUN 27, creatinine 1.80, sodium 140, potassium 3.6, chloride 106, anion gap 6, osmolality and calcium are normal. Lipase 61. Chest x-ray, PA and lateral views: No acute disease.   ASSESSMENT AND PLAN: A 64 year old African-American male presenting to the ER with a chief complaint of coffee-ground emesis since yesterday with no abdominal pain. Will be admitted with the following assessment and plan.   1. Upper gastrointestinal bleed, probably from Coumadin. Will admit the patient to telemetry bed. Keep him n.p.o., provide IV fluids. Continue Protonix drip. Hold Coumadin. Serial hemoglobin and  hematocrit monitoring, typing and screening is done. Consult GI.   2. Acute cystitis with budding yeast as well. Will provide IV Rocephin as the patient responded well with cephalosporins during the previous admission for E. coli urinary tract infection. Will provide IV fluconazole.  3. Metastatic bladder cancer status post urostomy, on chemotherapy. Will put a consult to Dr. [name], the patient's oncologist.  4. Acute kidney injury, probably from dehydration. Will provide IV fluids. Monitor renal function closely.  5. Hyperlipidemia. The patient was on statin. Will hold off on his home medication as he is n.p.o.   6. Hypertension. Blood pressure is stable. As the patient being n.p.o. and having gastrointestinal bleed, will hold off on the blood pressure medication.  7. Will provide gastrointestinal prophylaxis and deep vein thrombosis prophylaxis with sequential compression devices.   CODE STATUS: The patient is full code. Sister-in-law is medical power of attorney.   Diagnosis and plan of care were discussed in detail with the patient. He is aware of the plan.   TOTAL TIME SPENT ON ADMISSION: 45 minutes.   ____________________________ Nicholes Mango, MD ag:lb D: 01/12/2014 04:09:31 ET T: 01/12/2014 06:10:26 ET JOB#: [id number]  cc: Nicholes Mango, MD, <Dictator> Nicholes Mango MD ELECTRONICALLY SIGNED 01/27/2014 1:02

## 2015-03-31 NOTE — Consult Note (Signed)
Details:   - GI note:  I have seen and examined Mr Higham and agree with Tobe Sos a/p.   The coffee ground emesis was likely from the esophagitits in the setting of INR of 5.   No indication for EGD since was just done last month and revealed a source of bleeding ( severe esophagitis).   Recs: - protonix 40 BID for 1 month, then daily - ok to restart coumadin once INR normalized - montior Hgb until stable.   Electronic Signatures: Arther Dames (MD)  (Signed 24-Mar-15 16:48)  Authored: Details   Last Updated: 24-Mar-15 16:48 by Arther Dames (MD)

## 2015-03-31 NOTE — Consult Note (Signed)
PATIENT NAME:  Marcus Haney, Marcus Haney MR#:  [id number] DATE OF BIRTH:  [birth year]  DATE OF CONSULTATION:  01/12/2014  REFERRING PHYSICIAN:  Nicholes Mango, MD CONSULTING PHYSICIAN:  Andria Meuse, NP  PRIMARY CARE PHYSICIAN: Tomasita Morrow, MD  ONCOLOGIST: Leia Alf, MD  REASON FOR CONSULTATION: Coffee ground emesis.   HISTORY OF PRESENT ILLNESS: Marcus Haney is a 64 year old black male with history significant for stage IV metastatic bladder cancer with retroperitoneal, peritoneal, abdominal, and pelvic soft tissue mets and liver mets who is on palliative chemotherapy and steroids. year old black male with history significant for stage IV metastatic bladder cancer with retroperitoneal, peritoneal, abdominal, and pelvic soft tissue mets and liver mets who is on palliative chemotherapy and steroids. He received his last treatment earlier this week. He started therapy about 3 months ago. He reports yesterday around 10:00 a.m. he began to have coffee-ground emesis with some food mixed in with it. He continued to have about 7 episodes of emesis. He denies any pain with the vomiting. He denies any history of previous vomiting, or history of peptic ulcer disease. He was on antibiotics for a urinary tract infection and is shown to have E. coli UTI. He has had chronic indigestion for years and takes Prilosec 20 mg daily at home. He has had chronic hiccups recently. He denies any fever or chills. His weight has remained stable. He denies any anorexia. He does take Coumadin daily and his last dose was 8 a.m. yesterday. His current INR is 2.1. He is on Coumadin for his history of pulmonary emboli and he believes he has been on this for about a year. He has been started on Protonix drip since admission. He has been given Zofran p.r.n. for vomiting. His hemoglobin dropped from 10.4 three weeks ago down to 8.1 this admission.   PAST MEDICAL AND SURGICAL HISTORY: Stage IV bladder cancer with liver mets and abdominal, pelvic, and peritoneal mets status post urostomy and currently on palliative chemotherapy, chronic neutropenia secondary to chemo, pulmonary emboli, on Coumadin, hypertension, hyperlipidemia, prostate cancer status post  prostatectomy, anxiety and depression, status post hernia repair with perforated bowel requiring colectomy, appendectomy, penile implant, prostatectomy, cystectomy, and urostomy placement.   MEDICATIONS PRIOR TO ADMISSION: Coumadin 5 mg 1-1/2 tablets daily on Tuesday, Wednesday, Thursday, and Saturday and 5 mg Monday, Friday, and Sunday, simvastatin 40 mg at bedtime, promethazine 12.5 mg q. 6 hours, potassium chloride 10 mEq b.i.d., oxycodone 5 mg 1 to 2 tablets q. 3 hours p.r.n. pain, ondansetron 4 mg every 8 hours, methadone 10 mg t.i.d., magnesium oxide 400 mg t.i.d., lorazepam 1 mg q. 6 hours p.r.n., Diovan 80 mg daily, dexamethasone 4 mg tablets 2 mg at 10:00 p.m. the night before and 6 mg the day of chemotherapy, Coreg 6.25 mg p.o. b.i.d., and Prilosec 20 mg p.o. daily.   ALLERGIES: GABAPENTIN, LISINOPRIL AND SULFA.   FAMILY HISTORY: There is no family history for colorectal carcinoma, liver or chronic GI problems. Family history is positive for diabetes mellitus, stroke, and coronary artery disease.   SOCIAL HISTORY: He resides at home alone. He is single. He has 1 son who is disabled. He denies any history of tobacco, alcohol, or illicit drug use.   REVIEW OF SYSTEMS: See HPI. CONSTITUTIONAL: He has had some fatigue, weakness. Otherwise, a negative 12 point review of systems.  PHYSICAL EXAMINATION: VITAL SIGNS: Temperature 98 degrees, pulse 74, respirations 18, blood pressure 157/76, and O2 sat 97% on room air.  GENERAL: He is a chronically ill-appearing black male who is alert and oriented, pleasant and cooperative, in no acute distress.  HEENT: Sclerae clear. No icterus. Conjunctivae pink. Oropharynx pink and moist without any  lesions.  NECK: Supple without any mass or thyromegaly.  CHEST: Heart regular rate and rhythm S2 and S2. No murmurs, clicks, rubs, or gallops. He has a Port-A-Cath in the right upper anterior chest.  LUNGS: Clear to auscultation bilaterally.  ABDOMEN: Positive  bowel sounds x4. No bruits auscultated. He has a large ventral midline scar that is old and healed from previous bowel perforation. He has a urostomy in the right upper quadrant. Abdomen is soft, nontender, and nondistended without palpable hepatosplenomegaly or mass. Exam limited.   EXTREMITIES: Without edema, cyanosis, or clubbing.  MUSCULOSKELETAL: Good equal movement and strength bilaterally.  NEUROLOGIC: Grossly intact.  PSYCHIATRIC: He is alert and oriented. Normal mood and affect.  SKIN: Warm and dry without any rash or jaundice.   LABORATORY STUDIES: BUN 27 and creatinine 1.8, otherwise, normal basic metabolic panel. Lipase is 61 and albumin 3.1, otherwise, normal LFTs.   IMPRESSION: Mr. Marcus Haney is a pleasant 64 year old black male with stage IV metastatic bladder cancer, on Coumadin for history of pulmonary emboli, who presents with acute nausea, vomiting, and coffee-ground emesis. black male with stage IV metastatic bladder cancer, on Coumadin for history of pulmonary emboli, who presents with acute nausea, vomiting, and coffee-ground emesis. He has a 2 gram drop in hemoglobin in the past 3 weeks. His Coumadin was stopped yesterday and his INR was 2.1 yesterday. Esophagogastroduodenoscopy with Dr. Allen Norris to look for source of upper gastrointestinal bleeding and control bleeding. I have discussed the risks and benefits which include but are not limited to bleeding, infection, perforation, and drug reaction. He agrees with the plan. Consent will be obtained.   PLAN: 1.  Hold Coumadin.  2.  Esophagogastroduodenoscopy with Dr. Allen Norris today.  3.  Agree with Protonix drip.  4.  Follow hemoglobin and hematocrit and transfuse as necessary.  5.  Continue supportive measures.   We would like to thank you for allowing [place] to participate in the care of Marcus Haney. ____________________________ Andria Meuse, NP klj:sb D: 01/12/2014 10:36:36 ET T: 01/12/2014 10:50:57 ET JOB#: [id number]  cc: Andria Meuse, NP, <Dictator> Myrle Sheng. Jimmye Norman, MD Sandeep R. [name], MD [place] ELECTRONICALLY SIGNED 01/18/2014 11:27

## 2015-03-31 NOTE — Consult Note (Signed)
ONCOLOGY followup note - overall feels better. No nausea or vomiting today.   no fever. No diarrhea. No bleeding symptoms.sitting in bed, alert and oriented, NAD.            vitals - 97.5, 65, 18, 158/95, 100% on RA           lungs - b/l good BS, no rhonchi           abd - soft, NTWBC 3500, Hb 8.4, platelets 133, ANC 2200, Cr 1.34.   Impression/Recommendations:   64 year old gentleman with known history of stage IV metastatic bladder cancer more recently on palliative chemotherapy with Taxol and carboplatin, currently admitted with progressive weakness and coffee-ground emesis. EGD shows severe lower esophagitis.  Hemoglobin stable at 8.4, he is also on Coumadin and INR is 2.3.  Agree with ongoing supportive treatment.  Monitor INR and correct accordingly if he continues to have bleeding symptoms.  CBC otherwise does not show any neutropenia or thrombocytopenia.  No other acute oncology issues ongoing at this time.  No major pain issues either.  Oncology will continue to follow intermittently as indicated.  If discharged over the weekend, he was advised to keep followup appointments at [facility] same as previously scheduled.  Patient is agreeable to this plan.   Electronic Signatures: Jonn Shingles (MD)  (Signed on 06-Feb-15 10:22)  Authored  Last Updated: 06-Feb-15 10:22 by Jonn Shingles (MD)

## 2015-03-31 NOTE — Consult Note (Signed)
Brief Consult Note: Diagnosis: Coffee ground emesis/GIB/Anemia.   Patient was seen by consultant.   Comments: Mr. Marcus Haney is a  pleasant 64 y/o black male with Stage IV metastatic bladder cancer on coumadin for hx PE who presents with acute N/V/coffee ground emesis.  He has a 2 gram drop in Hgb in 3 weeks.  Coumadin stopped yesterday  & INR 2.1.  EGD with Dr Allen Norris to look for source of UGI bleeding & control bleeding.  Discussed risks/benefits of procedure which include but are not limited to bleeding, infection, perforation & drug reaction.  Patient agrees with this plan & consent will be obtained.  Plan: 1) Hold coumadin 2) EGD with Dr Allen Norris today 3) Agree with protonix gtt 4) Follow H/H & transfuse if necessary 5) Continue supportive measures Thanks for allowing [place] to participate in this patient's care.  Please see full dictated note. #[id number].  Electronic Signatures: Andria Meuse (NP)  (Signed 05-Feb-15 10:37)  Authored: Brief Consult Note   Last Updated: 05-Feb-15 10:37 by Andria Meuse (NP)

## 2015-03-31 NOTE — Consult Note (Signed)
  [place] PA ELECTRONICALLY SIGNED 02/28/2014 16:10

## 2015-03-31 NOTE — Consult Note (Signed)
PATIENT NAME:  Marcus Haney, Marcus Haney MR#:  [id number] DATE OF BIRTH:  1951/03/13  DATE OF CONSULTATION:  01/12/2014  REFERRING PHYSICIAN:  Dr. Margaretmary Eddy CONSULTING PHYSICIAN:  Jaimeson Gopal R. [name], MD  REASON FOR CONSULTATION:  Metastatic bladder cancer, admitted with coffee-ground emesis.   HISTORY OF PRESENT ILLNESS:  The patient is a 64 year old gentleman with known medical history of stage IV metastatic bladder cancer more recently started on third line palliative chemotherapy with Taxol/carboplatin, hypertension, PE December 2013, arthritis, allergies, prostate cancer status post prostatectomy 2001 who has been admitted to hospital with complaints of coffee-ground emesis for the last day or so accompanied by progressive weakness.  The patient states that he did not have any chest or abdominal pain or dry heaves, but started vomiting dark colored material.  He denies any diarrhea, bright red blood in stools or melena.  No other bleeding issues.  Hemoglobin earlier today was 8.7, platelets 162, WBC 8900.  He is on Coumadin, INR was 2.1.  Creatinine is 1.80 and liver function tests are mostly unremarkable except albumin of 3.1.  PTT 37.7.  He is planned for endoscopic evaluation.   PAST MEDICAL HISTORY AND PAST SURGICAL HISTORY:  As in HPI.   FAMILY HISTORY:  Noncontributory.   SOCIAL HISTORY:  He currently denies history of smoking or alcohol usage.  Physically active as much as possible.   ALLERGIES:  GABAPENTIN, SULFA, LISINOPRIL.   HOME MEDICATIONS: 1.  Simvastatin 40 mg daily.  2.  Promethazine 12.5 mg q. 6 hours.  3.  Potassium chloride 10 mEq twice daily.  4.  Oxycodone 5 to 10 mg q 3 hours as needed for pain.  5.  Coumadin 5 mg Monday, Wednesday, Friday, 7.5 mg other four days of the week.  6.  Zofran 4 mg q. 8 hours.  7.  Methadone 10 mg by mouth three times daily.  8.  Magnesium oxide 400 mg three times daily.  9.  Lorazepam 1 mg q. 6 hours as needed.  10.  Diovan 80 mg once daily.  11.   Decadron premedication prior to chemotherapy dosages.  12.  Coreg 6.25 mg by mouth twice daily.   REVIEW OF SYSTEMS:   CONSTITUTIONAL:  Generalized weakness and fatigue.  Denies any fevers or chills.  HEENT:  Denies headaches or dizziness at rest.  No epistaxis, ear or jaw pain.  CARDIAC:  Denies any chest pain, palpitations, orthopnea, or PND.  LUNGS:  Denies any dyspnea at rest, cough, sputum, or hemoptysis.  Has chronic dyspnea on exertion.  GASTROINTESTINAL:  No nausea or vomiting.  As in HPI.  GENITOURINARY:  No dysuria or hematuria.  SKIN:  No new rashes or pruritus.  NEUROLOGIC:  No new focal weakness, seizures or loss of consciousness.  MUSCULOSKELETAL:  No new joint pains or bone pains.  ENDOCRINE:  No polyuria or polydipsia.  Eating steady.   PHYSICAL EXAMINATION: GENERAL:  The patient is tired-looking, resting in bed, otherwise alert and oriented and converses appropriately.  No acute distress.  Pallor present.  VITAL SIGNS:  98, 61, 18, 151/75, 100% on room air.  HEENT:  Normocephalic, atraumatic.  Extraocular movements intact.  Sclerae anicteric.  No oral thrush.  NECK:  Negative for lymphadenopathy.  CARDIOVASCULAR:  S1, S2, regular rate and rhythm.  LUNGS:  Bilateral good air entry, slightly decreased at bases, no rhonchi.  ABDOMEN:  Soft, nontender.  No hepatosplenomegaly clinically.  Bowel sounds present.  EXTREMITIES:  Mild edema.  No cyanosis.  SKIN:  No  generalized rashes or major bruising.  NEUROLOGIC:  Limited exam.  Cranial nerves seem intact.  Moves all extremities spontaneously.   LABORATORY, DIAGNOSTIC, AND RADIOLOGICAL DATA:  As in HPI above.   IMPRESSION AND RECOMMENDATIONS:  A 64 year old gentleman with known history of stage IV metastatic bladder cancer more recently on palliative chemotherapy with Taxol and carboplatin, currently admitted with progressive weakness and coffee-ground emesis.  Hemoglobin was 8.7, he is also on Coumadin and INR is 2.1.  Agree  with ongoing supportive treatment.  Monitor INR and correct accordingly if he continues to have bleeding symptoms.  The patient is planned for endoscopic evaluation today.  CBC otherwise does not show any neutropenia or thrombocytopenia.  No other acute oncology issues ongoing at this time.  No major pain issues either.  Oncology will continue to follow intermittently as indicated.  The patient explained above, agreeable to this plan.   Thank you for the referral, please feel to contact me if any additional questions.    ____________________________ [name] [name], MD srp:ea D: 01/13/2014 00:04:32 ET T: 01/13/2014 00:30:40 ET JOB#: [id number]  cc: Trevor Iha R. [name], MD, <Dictator> Alveta Heimlich MD ELECTRONICALLY SIGNED 01/13/2014 8:58

## 2015-04-04 NOTE — H&P (Signed)
PATIENT NAME:  Marcus Haney, Marcus Haney MR#:  [id number] DATE OF BIRTH:  Dec 13, 1950  DATE OF ADMISSION:  12/03/2014  ONCOLOGIST:  Dr. [name]. Pandit.  EMERGENCY ROOM PHYSICIAN:  Dr. Lavonia Drafts.  CHIEF COMPLAINT: Intractable nausea, vomiting and generalized weakness.   HISTORY OF PRESENT ILLNESS: A 64 year old male patient with a history of bladder cancer metastases to liver comes in because of intermittent intractable nausea, vomiting going on for three days. The patient went [facility] yesterday and was given IV fluids and IV nausea medicine and sent home from the Emergency Room. He comes back today because of persistent nausea, vomiting, unable to keep p.o. stuff with generalized weakness. The patient denies any diarrhea and does have some lower quadrant abdominal pain. He rates it around 3 to 4 out of 10 in severity, not radiating into the back. The patient did not have any fever, no cough, no trouble breathing, and no associated symptoms. The patient's last chemotherapy was last Monday, that was on December 21.   PAST MEDICAL HISTORY: Significant for history of hypertension, pulmonary emboli, history of bladder cancer stage IV, metastases to the liver and multiple retroperitoneal and abdominal pelvic soft tissue masses.  Also significant for arthritis and prostate cancer with prostatectomy in 2001.   ALLERGIES: GABAPENTIN, LISINOPRIL AND SULFA.   SOCIAL HISTORY: Quit smoking 20 years ago, no drinking, no drugs, and lives alone.    FAMILY HISTORY: No hypertension or diabetes.   SOCIAL HISTORY: As I mentioned.   PAST SURGICAL HISTORY: Significant for bladder cancer surgery status post urostomy.   MEDICATIONS: Oxycodone 5 mg 1 to 2 tablets every four hours as needed for pain. Methadone 10 mg p.o. t.i.d., potassium chloride 10 mEq p.o. daily, Zofran 4 mg every 8 hours p.r.n. for nausea p.o., folic acid 1 mg p.o. daily, pantoprazole 40 mg p.o. daily, Coreg 6.25 mg p.o. b.i.d., simvastatin  40 mg p.o. daily, Vicodin 5 mg every day except Thursday and the patient takes 7.5 mg on Thursday only.     REVIEW OF SYSTEMS:   CONSTITUTIONAL: Feels fatigued, tired.  EYES: No blurred vision.  ENT: No tinnitus. No epistaxis. No difficulty swallowing.  RESPIRATIONS:  The patient does not have cough or trouble breathing.  CARDIOVASCULAR: No chest pain.  GASTROINTESTINAL: Abdomen, the patient has had nausea and vomiting for three days, unable to keep up the p.o. stuff, and does not have any diarrhea.  GENITOURINARY: No dysuria. The patient has a history of bladder cancer status post bladder surgery, now has urostomy.  The bag is changed every three days.  HEMATOLOGIC:  He does have anemia due to chemotherapy,  INTEGUMENTARY: No skin rashes.  MUSCULOSKELETAL: No joint pain.  NEUROLOGIC: No numbness or weakness.  PSYCHIATRIC:  No anxiety or insomnia.   PHYSICAL EXAMINATION:  VITAL SIGNS: Temperature 97.3, heart rate 66, blood pressure 152/70, pulse oxygen 99% on room.  He is alert, awake and oriented elderly male, not in distress, answering questions appropriately.  HEAD:  Atraumatic, normocephalic.  EYES: Pupils equal, reacting to light. Extraocular movements are intact. ENT: No tympanic membrane congestion. No turbinate hypertrophy. No oropharyngeal erythema.  NECK: Supple. No JVD. Normal range of motion. No lymphadenopathy.  CARDIOVASCULAR: S1, S2, regular. PMI not displaced. The patient's pedal pulses are intact in carotid and dorsalis pedis and femoral arteries. No extremity edema.  PULMONARY:  Lungs are clear to auscultation. No wheeze, no rales, not using accessory muscles of respiration.  GASTROINTESTINAL: Abdomen is soft. There is a midline  abdominal scar and a urostomy bag is in the right lower quadrant.  No tenderness to palpation. No organomegaly.  No hernias.  EXTREMITIES: No extremity edema. No cyanosis and no clubbing,  MUSCULOSKELETAL: Able to move all extremities x4. No joint  effusion. No pedal edema. LYMPHATICS:  No lymphadenopathy in cervical or axillary region.   NEUROLOGIC: Cranial nerves II to XII intact. Power 5/5 in upper and lower extremities.  Sensation intact. DTRs 2+ bilaterally.   PSYCHIATRIC: Motor and affect are within normal limits.   LABORATORY DATA: WBC 7.3, hemoglobin 9.8, hematocrit 31, platelets 228, INR 2.2. Lipase 69. Troponin less than 0.02. The patient's electrolytes sodium is 137, potassium 3.6, chloride 102, bicarbonate 25, BUN 15, creatinine 1.1, and glucose 113. LFTs within normal limits, WBC 2, hemoglobin 8.7, hematocrit 27.8, and platelets 123. INR 2.2. EKG showed normal sinus rhythm with no ST-T changes.   ASSESSMENT AND PLAN:   1.  The patient is a 64 year old male patient with invasive bladder cancer stage IV bladder cancer with metastases to liver who comes in because of intractable nausea and vomiting, unable to keep p.o. stuff. The patient is going to be admitted to observation and start him on IV fluids at normal saline at 80 mL an hour. Start him on IV Zofran and also IV PPIs. The patient will be started on clear liquids and will advance if he tolerates the clear liquids.  2.  Pancytopenia with anemia thrombocytopenia likely due to chemotherapy. 3.  Stage IV bladder cancer, has a urostomy and follows up with Dr. [name] for chemotherapy.  4.  Hypertension. Blood pressure is controlled.  5.  History of pulmonary embolism. He is on Coumadin. INR is therapeutic. Continue Coumadin pharmacy to dose Coumadin.  6.  Hyperlipidemia. Continue simvastatin.  7.  Hypertension. Continue Coreg 6.25 mg p.o. b.i.d.  8.  Chronic pain. He is on methadone and also oxycodone.  He has nausea, vomiting, so we could use IV Dilaudid for the pain at 2 mg every four hours for moderate pain for pain of 4 to 6.   TIME SPENT: More than 55 minutes.   ____________________________ Epifanio Lesches, MD sk:at D: 12/03/2014 15:13:00 ET T: 12/03/2014 16:06:12  ET JOB#: [id number]  cc: Epifanio Lesches, MD, <Dictator> Epifanio Lesches MD ELECTRONICALLY SIGNED 01/01/2015 6:19

## 2015-04-08 NOTE — Discharge Summary (Signed)
PATIENT NAME:  Marcus Haney, Marcus Haney MR#:  [id number] DATE OF BIRTH:  [birth year]  DATE OF ADMISSION:  12/03/2014 DATE OF DISCHARGE:  12/05/2014  ADMITTING DIAGNOSIS: Intractable nausea, vomiting.   DISCHARGE DIAGNOSES:   1.  Intractable nausea, vomiting.   2.  Pancytopenia.    3.  Pyuria.   4.  Headache, likely transient opiate withdrawal, resolved.  5.  History of stage IV bladder carcinoma with urostomy.  6.  Mild hematuria.   7.  Generalized weakness.  8.  History of pulmonary embolism, on Coumadin therapy with prothrombin time/international normalized ratio level of 25.5 and 2.4 respectively on 12/05/2014.  9.  Hypertension.  10.  Arthritis.   11.  Prostate carcinoma.   DISCHARGE CONDITION: Stable.   DISCHARGE MEDICATIONS: The patient is to continue Coreg 6.25 mg p.o. twice daily, simvastatin 40 mg p.o. daily at bedtime, pantoprazole 40 mg p.o. daily, warfarin 5 mg p.o. every day except on Mondays and Thursdays, and 1-1/2 tablets, will be 7.5 mg on Mondays, Thursdays, folic acid 1 mg p.o. daily, potassium chloride 10 mEq once daily, Zofran ODT 4 mg every 8 hours as needed, ciprofloxacin 500 mg twice daily for 10 days, Zofran disintegrated tablet 4 mg 3 times daily as needed, methadone 10 mg p.o. 3 times daily, oxycodone 5 mg 1-2 tablets every 4 hours as needed, alprazolam 0.25 mg every 8 hours as needed, magnesium oxide 400 mg every 8 hours.   HOME OXYGEN: None.   DIET: Regular, regular consistency. The patient was advised pancytopenic precautions.    ACTIVITY LIMITATIONS: As tolerated.   REFERRAL: To home health physical therapy.   FOLLOWUP APPOINTMENTS:  With Dr. Tomasita Morrow in 2-3 days after discharge, Dr. [name] in 2-3 days after discharge.    CONSULTANTS: Care management, social work, Dr. [name].   RADIOLOGIC STUDIES: None.   HOSPITAL COURSE: The patient is a 64 year old male with history of cancer who presented to the hospital with complaints of intermittent nausea and  vomiting for the past 3 days. He was seen at [facility] 1 day before admission to our hospital, was given some IV fluids as well as nausea medication, was sent home, comes back with inability to keep orally any food. He had no diarrhea or some significant abdominal pains and no other abnormalities. His last chemotherapy was on 11/27/2014. On arrival to our hospital the patient's temperature is 97.3, pulse was 66, blood pressure 152/70, oxygenation was 99% on room air. Physical exam was unremarkable. Midline abdominal scar was noted as well as urostomy bag in the right lower quadrant but no significant tenderness to palpation.   The patient's lab data done on 12/03/2014 showed elevation of glucose level to 113. Otherwise, the patient's BMP was normal. Lipase level was 69. The patient's liver enzymes showed elevation of alkaline phosphatase of 120, albumin level of 2.6, AST was 46. Otherwise, liver enzymes were normal. Troponin was less than 0.02. White blood cell count was low at 2.0. Hemoglobin was 8.7. Platelet count was 123. Absolute neutrophil count was 1.6. The patient's coagulation panel, prothrombin time was 20.9, INR was 1.8 on 12/03/2014. Urinalysis revealed 186 red blood cells and 13 white blood cells. No bacteria or epithelial cells were noted.   The patient was admitted to the hospital for further evaluation. He was started on IV fluids as well as some medications, antiemetics, for his nausea and vomiting. With this, his condition improved and he was able to eat, which diet was advanced as time progressed.  The patient's pancytopenia however remained stable and by 12/05/2014 it did not improve significantly. However, it was felt that patient is stable to be discharged home with antibiotic therapy for home. The patient was advised to continue antibiotics and follow up with his primary care physician, Dr. [name], in the next few days after discharge.   In regards to pyuria, it was felt the  patient's pyuria was very likely due to indwelling catheter. The patient's urine cultures were checked. Urostomy was cultured and it revealed more than 100,000 colony-forming units of coagulase negative Staphylococcus, also more than 100,000 colony-forming units of aerobic gram positive rod with no further identification. It was felt that the patient's urostomy cultures revealed only colonization since he was otherwise asymptomatic.   Regarding headaches, it was felt the patient's headaches were very likely transient opiate withdrawal since the patient was not given any opiates IV when he had episodes of nausea and vomiting. However, upon restarting of his usual doses of methadone as well as oxycodone his headache resolved.   In regards to history of stage IV bladder carcinoma, the patient is to continue his outpatient management.   For generalized weakness, he was evaluated by physical therapist and home health physical therapy was recommended.    In regards to hematuria, it was felt the patient's hematuria was very likely related to his bladder cancer and no significant intervention was performed. The patient's hemoglobin level was checked while he was in the hospital and it remained stable.   Regarding history of pulmonary embolism and Coumadin therapy, the patient's Coumadin was advanced and on the day of discharge the patient's prothrombin time was 25.5 and INR was 2.4. It is recommended to follow the patient's prothrombin time as well as INR as outpatient to ensure its stability since he is on antibiotic therapy.   For other chronic medical problems such as hypertension, arthritis as well as prostate carcinoma, the patient is to continue his outpatient medications. He is being discharged in stable condition with above-mentioned medications and followup.  On the day of discharge, temperature was 97.4, pulse rate 65, respiration rate was 18, blood pressure 161/94, saturation was 99% on room air at  rest.   TIME SPENT: 40 minutes.   ____________________________ Theodoro Grist, MD rv:AT D: 12/11/2014 16:39:23 ET T: 12/12/2014 00:24:52 ET JOB#: [id number]  cc: Theodoro Grist, MD, <Dictator> Sandeep R. [name], MD Unknown cc  Ivori Storr MD ELECTRONICALLY SIGNED 12/21/2014 10:24

## 2015-04-18 ENCOUNTER — Other Ambulatory Visit: Payer: Self-pay | Admitting: *Deleted

## 2015-04-18 DIAGNOSIS — C679 Malignant neoplasm of bladder, unspecified: Secondary | ICD-10-CM

## 2015-04-18 NOTE — Telephone Encounter (Signed)
Needs refill on Methadone and Oxycodone today Got Oxycodone #120 tabs on 4/26 and Methadone #45 on 4/26

## 2015-04-25 ENCOUNTER — Other Ambulatory Visit: Payer: Self-pay | Admitting: Internal Medicine

## 2015-04-28 ENCOUNTER — Other Ambulatory Visit: Payer: Self-pay | Admitting: Internal Medicine

## 2015-05-01 ENCOUNTER — Other Ambulatory Visit: Payer: Self-pay | Admitting: *Deleted

## 2015-05-01 MED ORDER — METHADONE HCL 10 MG PO TABS: 10.0000 mg | ORAL_TABLET | Freq: Three times a day (TID) | ORAL | Status: DC

## 2015-05-01 MED ORDER — OXYCODONE HCL 5 MG PO TABS: ORAL_TABLET | ORAL | Status: DC

## 2015-05-01 NOTE — Telephone Encounter (Signed)
Rx faxed to [facility] in [place]

## 2015-05-08 ENCOUNTER — Telehealth: Payer: Self-pay | Admitting: *Deleted

## 2015-05-08 NOTE — Telephone Encounter (Signed)
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  He has no sx and no fever.  Wants to ask md about ua /cult and i spoke to pandit and he is agreeable for ua and cult..  They will take care of it.

## 2015-05-09 ENCOUNTER — Telehealth: Payer: Self-pay | Admitting: *Deleted

## 2015-05-09 NOTE — Telephone Encounter (Signed)
Advised Phoebe that per Dr [name], hospice to infuse 2 L NS over 6 hours.

## 2015-05-16 ENCOUNTER — Other Ambulatory Visit: Payer: Self-pay | Admitting: Internal Medicine

## 2015-05-16 ENCOUNTER — Other Ambulatory Visit: Payer: Self-pay | Admitting: *Deleted

## 2015-05-16 MED ORDER — OXYCODONE HCL 5 MG PO TABS: ORAL_TABLET | ORAL | Status: DC

## 2015-05-16 MED ORDER — METHADONE HCL 10 MG PO TABS: 10.0000 mg | ORAL_TABLET | Freq: Three times a day (TID) | ORAL | Status: DC

## 2015-05-16 NOTE — Telephone Encounter (Signed)
faxed

## 2015-05-16 NOTE — Telephone Encounter (Signed)
Rx faxed

## 2015-05-17 ENCOUNTER — Inpatient Hospital Stay
Admission: EM | Admit: 2015-05-17 | Discharge: 2015-05-19 | DRG: 444 | Disposition: A | Discharge status: Hospice | Attending: Internal Medicine | Admitting: Internal Medicine

## 2015-05-17 ENCOUNTER — Emergency Department

## 2015-05-17 ENCOUNTER — Encounter: Payer: Self-pay | Admitting: *Deleted

## 2015-05-17 DIAGNOSIS — Z515 Encounter for palliative care: Secondary | ICD-10-CM

## 2015-05-17 DIAGNOSIS — R112 Nausea with vomiting, unspecified: Secondary | ICD-10-CM | POA: Diagnosis not present

## 2015-05-17 DIAGNOSIS — Z9079 Acquired absence of other genital organ(s): Secondary | ICD-10-CM | POA: Diagnosis present

## 2015-05-17 DIAGNOSIS — T451X5A Adverse effect of antineoplastic and immunosuppressive drugs, initial encounter: Secondary | ICD-10-CM | POA: Diagnosis present

## 2015-05-17 DIAGNOSIS — C7931 Secondary malignant neoplasm of brain: Secondary | ICD-10-CM | POA: Diagnosis present

## 2015-05-17 DIAGNOSIS — R110 Nausea: Secondary | ICD-10-CM

## 2015-05-17 DIAGNOSIS — K810 Acute cholecystitis: Secondary | ICD-10-CM | POA: Diagnosis present

## 2015-05-17 DIAGNOSIS — M1990 Unspecified osteoarthritis, unspecified site: Secondary | ICD-10-CM | POA: Diagnosis present

## 2015-05-17 DIAGNOSIS — Z888 Allergy status to other drugs, medicaments and biological substances status: Secondary | ICD-10-CM | POA: Diagnosis not present

## 2015-05-17 DIAGNOSIS — Z906 Acquired absence of other parts of urinary tract: Secondary | ICD-10-CM | POA: Diagnosis present

## 2015-05-17 DIAGNOSIS — K819 Cholecystitis, unspecified: Secondary | ICD-10-CM | POA: Diagnosis present

## 2015-05-17 DIAGNOSIS — A419 Sepsis, unspecified organism: Secondary | ICD-10-CM | POA: Diagnosis present

## 2015-05-17 DIAGNOSIS — Z8546 Personal history of malignant neoplasm of prostate: Secondary | ICD-10-CM | POA: Diagnosis not present

## 2015-05-17 DIAGNOSIS — Z66 Do not resuscitate: Secondary | ICD-10-CM | POA: Diagnosis present

## 2015-05-17 DIAGNOSIS — I10 Essential (primary) hypertension: Secondary | ICD-10-CM | POA: Diagnosis present

## 2015-05-17 DIAGNOSIS — C7911 Secondary malignant neoplasm of bladder: Secondary | ICD-10-CM

## 2015-05-17 DIAGNOSIS — Z936 Other artificial openings of urinary tract status: Secondary | ICD-10-CM | POA: Diagnosis not present

## 2015-05-17 DIAGNOSIS — Z86711 Personal history of pulmonary embolism: Secondary | ICD-10-CM

## 2015-05-17 DIAGNOSIS — E785 Hyperlipidemia, unspecified: Secondary | ICD-10-CM | POA: Diagnosis present

## 2015-05-17 DIAGNOSIS — Z79899 Other long term (current) drug therapy: Secondary | ICD-10-CM

## 2015-05-17 DIAGNOSIS — D630 Anemia in neoplastic disease: Secondary | ICD-10-CM | POA: Diagnosis present

## 2015-05-17 DIAGNOSIS — G620 Drug-induced polyneuropathy: Secondary | ICD-10-CM | POA: Diagnosis present

## 2015-05-17 DIAGNOSIS — Z882 Allergy status to sulfonamides status: Secondary | ICD-10-CM | POA: Diagnosis not present

## 2015-05-17 DIAGNOSIS — D72829 Elevated white blood cell count, unspecified: Secondary | ICD-10-CM | POA: Diagnosis not present

## 2015-05-17 DIAGNOSIS — Z79891 Long term (current) use of opiate analgesic: Secondary | ICD-10-CM | POA: Diagnosis not present

## 2015-05-17 DIAGNOSIS — C787 Secondary malignant neoplasm of liver and intrahepatic bile duct: Secondary | ICD-10-CM | POA: Diagnosis present

## 2015-05-17 DIAGNOSIS — Z8551 Personal history of malignant neoplasm of bladder: Secondary | ICD-10-CM | POA: Diagnosis not present

## 2015-05-17 HISTORY — DX: Malignant (primary) neoplasm, unspecified: C80.1

## 2015-05-17 HISTORY — DX: Essential (primary) hypertension: I10

## 2015-05-17 LAB — CBC
HEMATOCRIT: 23.4 % — AB (ref 40.0–52.0)
HEMOGLOBIN: 7.3 g/dL — AB (ref 13.0–18.0)
MCH: 27.9 pg (ref 26.0–34.0)
MCHC: 31.2 g/dL — ABNORMAL LOW (ref 32.0–36.0)
MCV: 89.5 fL (ref 80.0–100.0)
Platelets: 159 10*3/uL (ref 150–440)
RBC: 2.62 MIL/uL — AB (ref 4.40–5.90)
RDW: 20.9 % — AB (ref 11.5–14.5)
WBC: 19.7 10*3/uL — ABNORMAL HIGH (ref 3.8–10.6)

## 2015-05-17 LAB — COMPREHENSIVE METABOLIC PANEL
ALBUMIN: 2.2 g/dL — AB (ref 3.5–5.0)
ALK PHOS: 329 U/L — AB (ref 38–126)
ALT: 34 U/L (ref 17–63)
AST: 51 U/L — ABNORMAL HIGH (ref 15–41)
Anion gap: 11 (ref 5–15)
BUN: 24 mg/dL — ABNORMAL HIGH (ref 6–20)
CO2: 23 mmol/L (ref 22–32)
CREATININE: 1.52 mg/dL — AB (ref 0.61–1.24)
Calcium: 8.2 mg/dL — ABNORMAL LOW (ref 8.9–10.3)
Chloride: 97 mmol/L — ABNORMAL LOW (ref 101–111)
GFR calc non Af Amer: 47 mL/min — ABNORMAL LOW (ref >60)
GFR, EST AFRICAN AMERICAN: 55 mL/min — AB (ref >60)
Glucose, Bld: 102 mg/dL — ABNORMAL HIGH (ref 65–99)
Potassium: 3.9 mmol/L (ref 3.5–5.1)
Sodium: 131 mmol/L — ABNORMAL LOW (ref 135–145)
Total Bilirubin: 6.6 mg/dL — ABNORMAL HIGH (ref 0.3–1.2)
Total Protein: 7.2 g/dL (ref 6.5–8.1)

## 2015-05-17 LAB — URINALYSIS COMPLETE WITH MICROSCOPIC (ARMC ONLY)
Bacteria, UA: NONE SEEN
GLUCOSE, UA: NEGATIVE mg/dL
KETONES UR: NEGATIVE mg/dL
NITRITE: NEGATIVE
Protein, ur: 30 mg/dL — AB
SPECIFIC GRAVITY, URINE: 1.014 (ref 1.005–1.030)
pH: 6 (ref 5.0–8.0)

## 2015-05-17 LAB — LIPASE, BLOOD: Lipase: 42 U/L (ref 22–51)

## 2015-05-17 LAB — TROPONIN I: TROPONIN I: 0.03 ng/mL (ref <0.031)

## 2015-05-17 IMAGING — CT CT ABD-PELV W/O CM
2 of 4 series · 16 of 46 positions shown, 18 images · non-contrast
Comparison: 08/09/2014, 10/08/13

CLINICAL DATA: Restaging bladder cancer. Post cystectomy.
Chemotherapy previously.

EXAM:
CT ABDOMEN AND PELVIS WITHOUT CONTRAST
TECHNIQUE: Multidetector CT imaging of the abdomen and pelvis was performed
following the standard protocol without IV contrast.

[Series 2: routine abd pel without · axial · non-contrast · 0.73mm/px · z∈[+316,+736]mm · 13 of 92 slices shown, 15 images]
[im 4/92  soft-tissue]
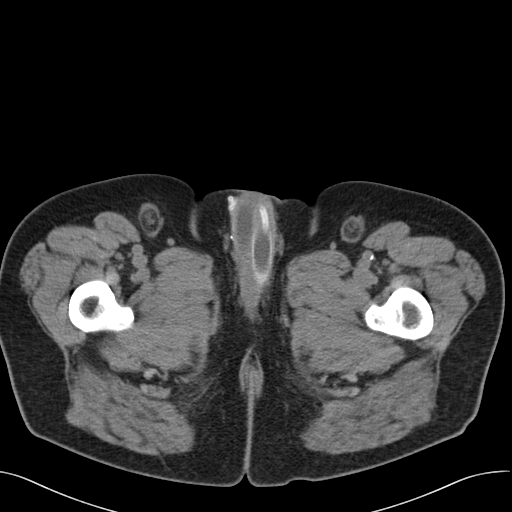
[im 4/92  bone]
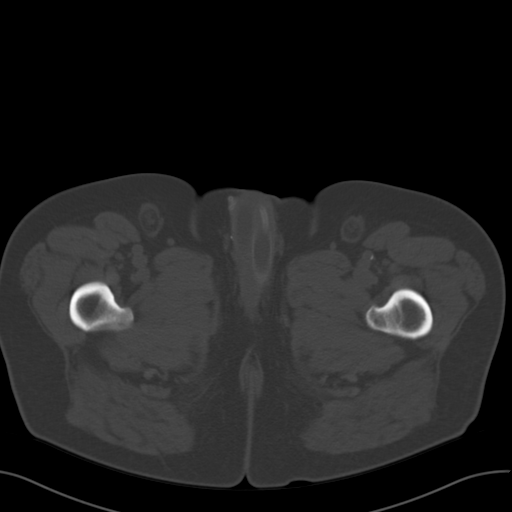
[im 11/92  soft-tissue]
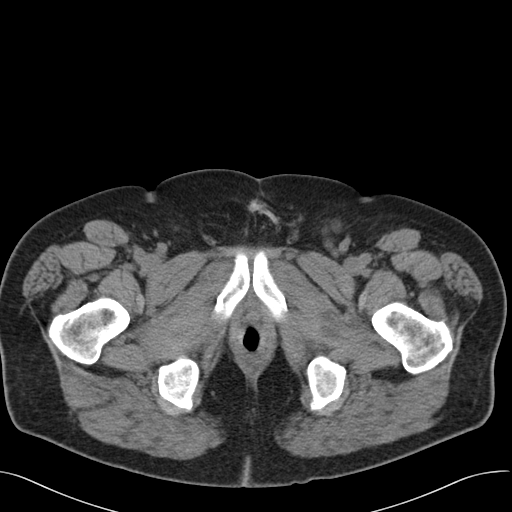
[im 19/92  soft-tissue]
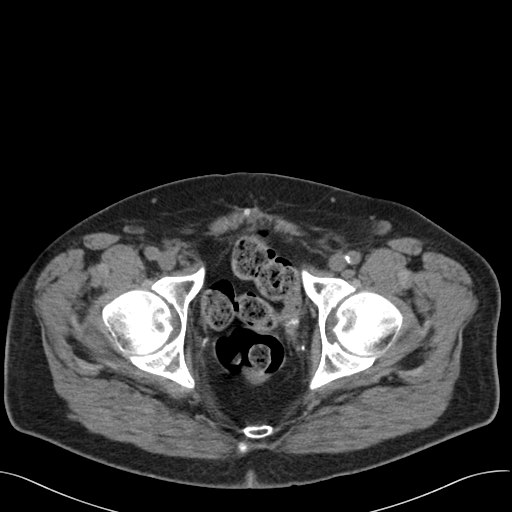
[im 26/92  soft-tissue]
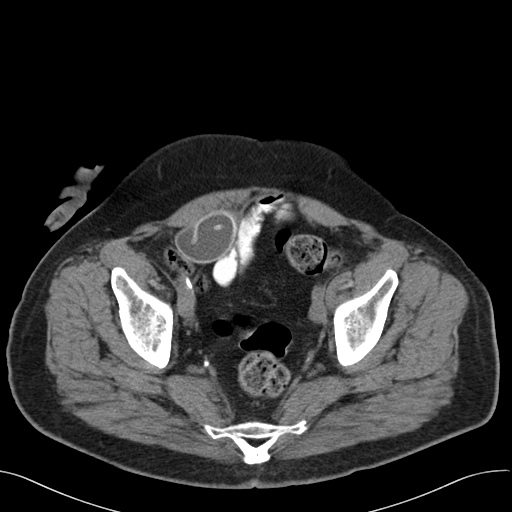
[im 33/92  soft-tissue]
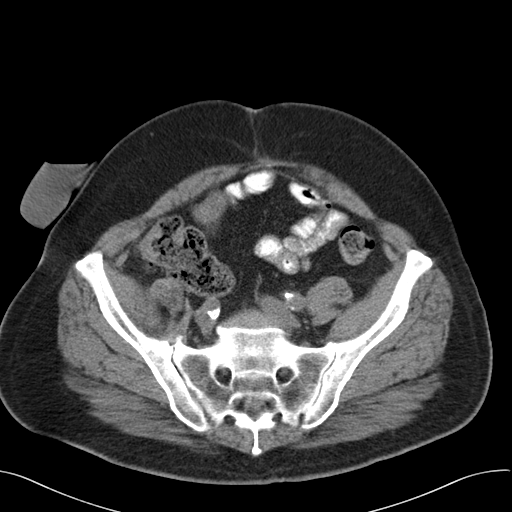
[im 41/92  soft-tissue]
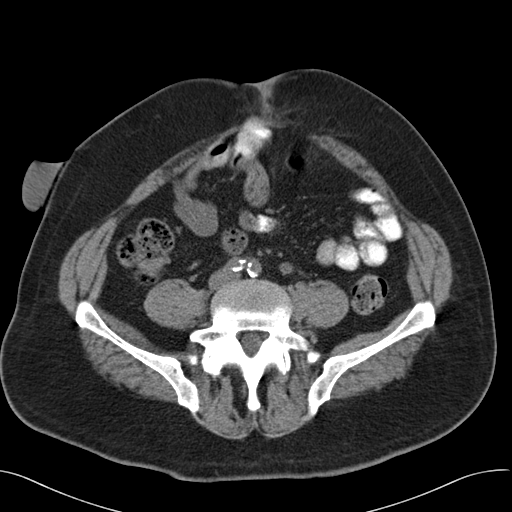
[im 48/92  soft-tissue]
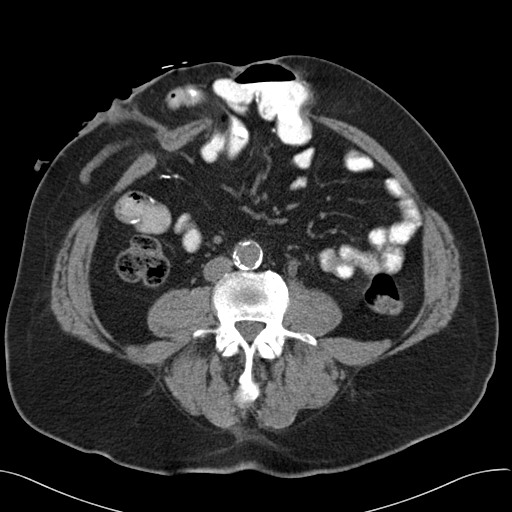
[im 51/92  soft-tissue]
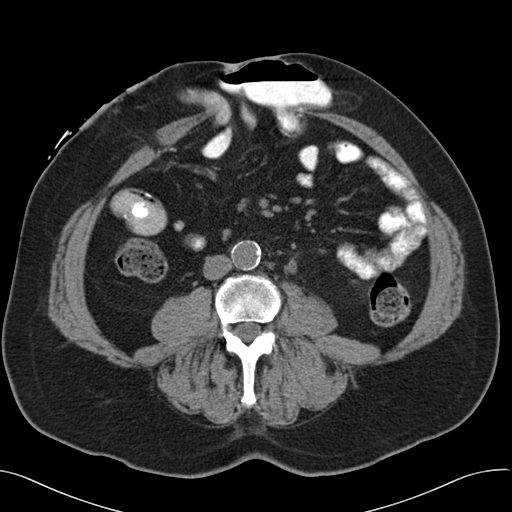
[im 59/92  soft-tissue]
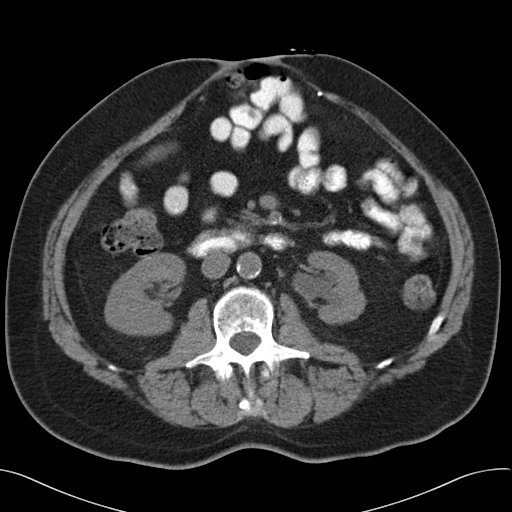
[im 59/92  bone]
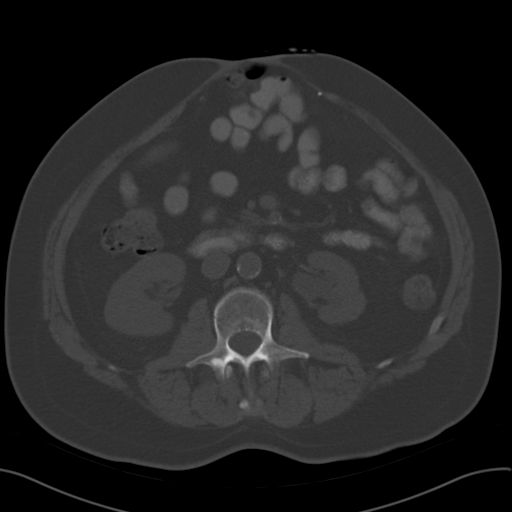
[im 66/92  soft-tissue]
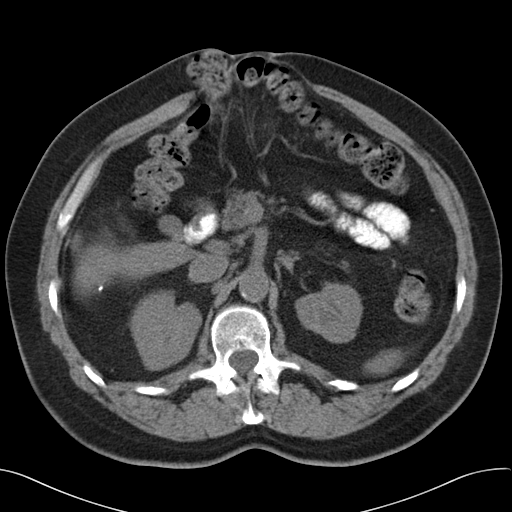
[im 73/92  soft-tissue]
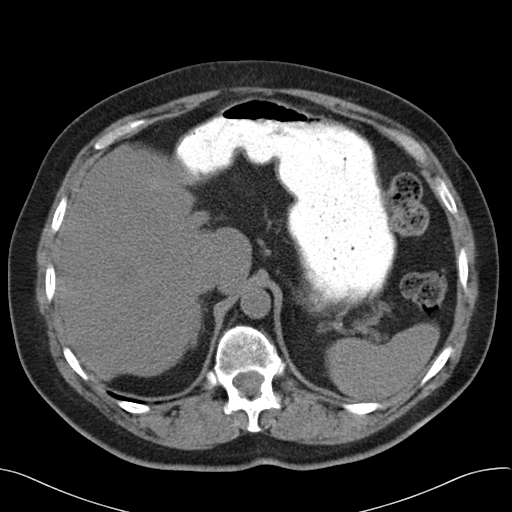
[im 81/92  soft-tissue]
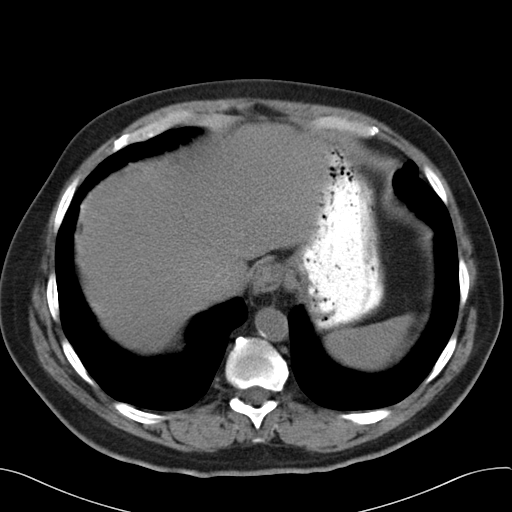
[im 88/92  soft-tissue]
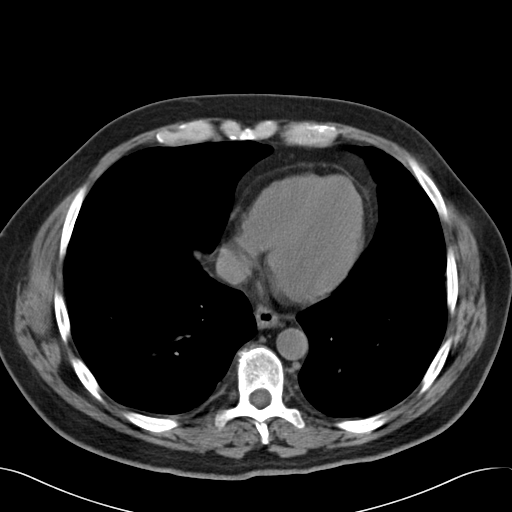

[Series 5: cor routine abd pel wo · coronal · 0.72mm/px · 3 of 156 slices shown]
[im 52/156  soft-tissue]
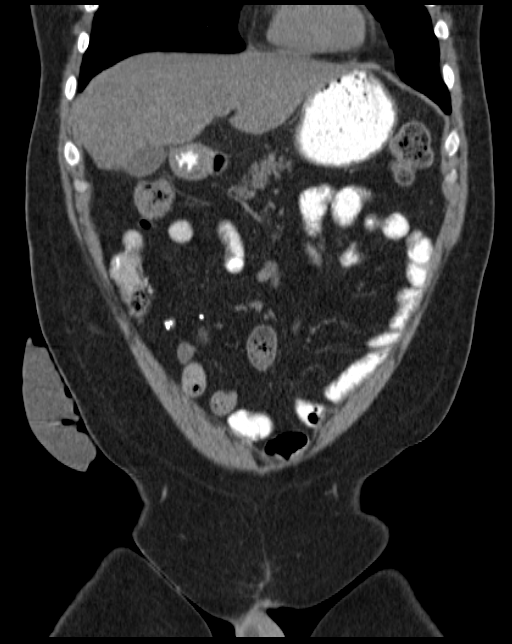
[im 69/156  soft-tissue]
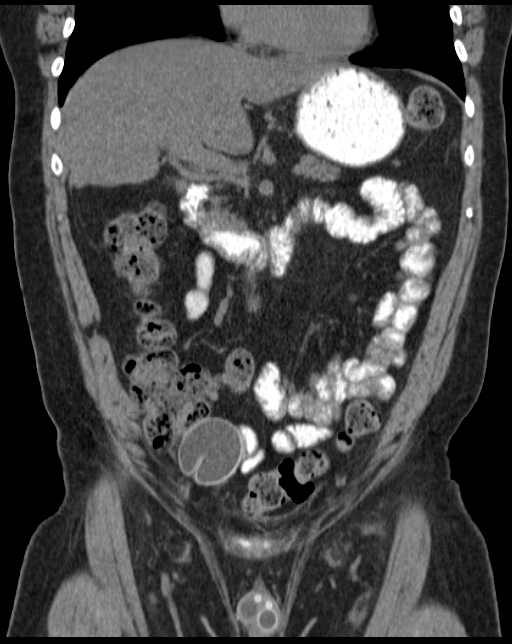
[im 87/156  soft-tissue]
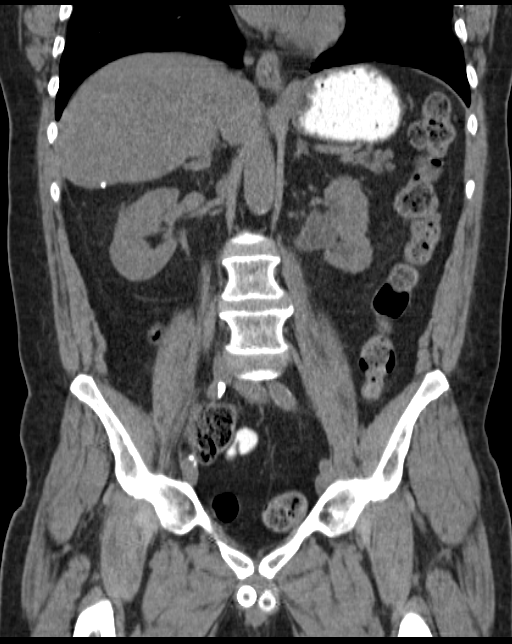

[16 of 46 positions shown; findings below may reference images not displayed]

FINDINGS: 6 mm right lower lobe pleural nodule is stable since 10/08/2013
least recent available comparison exam.

Unenhanced CT was performed per clinician order. Lack of IV contrast
limits sensitivity and specificity, especially for evaluation of
abdominal/pelvic solid viscera. Stable moderate left hydronephrosis,
with gradual decrease in caliber of the left ureter to the level of
right lower quadrant ileal conduit. No new perinephric collection
identified or right-sided hydronephrosis. Adrenal glands, liver,
gallbladder, spleen, and pancreas are normal.

Multiple bowel containing ventral abdominal wall hernias are
reidentified without evidence for obstruction. No bowel wall
thickening or focal segmental dilatation is identified. No
lymphadenopathy. No free fluid or air.

Surgical change post cystectomy noted. Penile implant partly
visualized with right lower quadrant reservoir reidentified.
Evidence of prostatectomy.

Mild lumbar spine disc degenerative change. No lytic or sclerotic
osseous lesion. Stable multilevel lumbar spine central endplate
depression deformities reidentified.
IMPRESSION: Improved left hydroureter with stable degree of moderate left
hydronephrosis. No new evidence for intra-abdominal or pelvic
metastatic disease.

## 2015-05-17 MED ORDER — IOHEXOL 300 MG/ML  SOLN
80.0000 mL | Freq: Once | INTRAMUSCULAR | Status: AC | PRN
  Administered 2015-05-17: 100 mL via INTRAVENOUS

## 2015-05-17 MED ORDER — PIPERACILLIN-TAZOBACTAM 3.375 G IVPB
INTRAVENOUS | Status: AC
  Administered 2015-05-17: 3.375 g via INTRAVENOUS
  Filled 2015-05-17: qty 50

## 2015-05-17 MED ORDER — PIPERACILLIN-TAZOBACTAM 3.375 G IVPB
3.3750 g | Freq: Once | INTRAVENOUS | Status: AC
  Administered 2015-05-17: 3.375 g via INTRAVENOUS

## 2015-05-17 MED ORDER — IOHEXOL 240 MG/ML SOLN
25.0000 mL | Freq: Once | INTRAMUSCULAR | Status: AC | PRN
  Administered 2015-05-17: 25 mL via ORAL

## 2015-05-17 MED ORDER — SODIUM CHLORIDE 0.9 % IV BOLUS (SEPSIS): 1000.0000 mL | Freq: Once | INTRAVENOUS | Status: DC

## 2015-05-17 MED ORDER — SODIUM CHLORIDE 0.9 % IV BOLUS (SEPSIS)
500.0000 mL | Freq: Once | INTRAVENOUS | Status: AC
  Administered 2015-05-17: 500 mL via INTRAVENOUS

## 2015-05-17 NOTE — ED Notes (Signed)
Pt hospice nurse sent him here for evaluation of possible dehydration per the patient. Pt states he has not eaten and felt weak over the past 3-4 days.

## 2015-05-17 NOTE — Consult Note (Signed)
CC: Nausea/vomiting x 3 days  HPI: Mr. Arizpe is a pleasant 64 yo M with a history of stage IV bladder cancer with liver metastases who presents with 3 days of N/V and belching.  No vomiting while in ER.  No RUQ pain.  Also a history of suprapubic pain which has been going on for months.  WBC elevated at 19.7 and CT which shows pericholecystic changes. No fevers/chills, night sweats, shortness of breath, cough, chest pain, diarrhea/constipation, dysuria/hematuria.  No sick contacts, no unusual ingestions.    Active Ambulatory Problems    Diagnosis Date Noted  . No Active Ambulatory Problems   Resolved Ambulatory Problems    Diagnosis Date Noted  . No Resolved Ambulatory Problems   Past Medical History  Diagnosis Date  . Cancer   . Hypertension    - + pulmonary embolism H/o arthritis H/o prostate cancer s/p prostatectomy Bladder cancer stage IV  History   Social History  . Marital Status: Single    Spouse Name: N/A  . Number of Children: N/A  . Years of Education: N/A   Occupational History  . Not on file.   Social History Main Topics  . Smoking status: Never Smoker   . Smokeless tobacco: Not on file  . Alcohol Use: No  . Drug Use: No  . Sexual Activity: Not on file   Other Topics Concern  . Not on file   Social History Narrative  . No narrative on file   Allergies  Allergen Reactions  . Lisinopril Anaphylaxis  . Gabapentin Nausea Only  . Sulfa Antibiotics Rash   No HTN or DM  Meds Xanax Coreg Lasix Methadone Prilosec Oxycodone Phenergan Zocor  ROS: Full ROS obtained, pertinent positives and negatives as above  Blood pressure 141/76, pulse 104, temperature 97.6 F (36.4 C), temperature source Oral, resp. rate 17, height 5\' 7"  (1.702 m), SpO2 99 %. GEN: NAD/A&Ox3 FACE: no obvious facial trauma, normal external nose, normal external ears EYES: no scleral icterus, no conjunctivitis HEAD: normocephalic atraumatic CV: RRR, no MRG RESP: moving air well,  lungs clear ABD: soft, nontender, nondistended, no tenderness in RUQ, min tenderness suprapubic EXT: moving all ext well, strength 5/5 NEURO: cnII-XII grossly intact, sensation intact all 4 ext  Labs: Personally reviewed - significant for: WBC 19.7 Cr. 1.52, BUN 24 AP 329, Bili 6.6  CT: Personally reviewed - multiple abdominal wall hernias - inflammatory changes around gallbladder  A/P 64 yo M with metastatic bladder cancer who presents with leukocytosis, hyperbilirubinemia and CT scan suggestive of cholecystitis.  Absolutely no pain in RUQ.  Unsure whether hyperbilirubinemia is due to cholelithiasis or due to significant cancer burden of liver.  Lack of pain suggests against cholecystitis, but would not recommend cholecystectomy for three reasons: 1.) Patient has terminal cancer and 2.) Large abdominal wall hernias which would necessitate open surgery, which would require a Casas time to recover from due to patient nutrition status and advanced cancer and 3.) has intraperitoneal mets and I cannot ensure that they are not involving biliary system and that gallbladder may be unresectable.  Would recommend admission to IM, antibiotics and GI referral to evaluate hyperbilirubinemia.  If patient does develop RUQ pain and does not resolve leukocytosis may consider cholecystostomy tube.

## 2015-05-17 NOTE — ED Notes (Signed)
Patient transported to CT 

## 2015-05-17 NOTE — ED Provider Notes (Signed)
[facility] Emergency Department Provider Note  ____________________________________________  Time seen: Approximately 4:39 PM  I have reviewed the triage vital signs and the nursing notes.   HISTORY  Chief Complaint Weakness    HPI [name] is a 64 y.o. 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 Patient reports that he has been nauseated for 3 or 4 days, no vomiting, and no diarrhea, no abdominal pain. He denies fevers. He was told it was too late in the day to receive fluids via hospice so he presented to the emergency department. He denies chest pain or difficulty breathing. He feels generally weak and fatigued. Current severity is moderate. This has been gradual in onset and constant since began. No modifying factors.   Past Medical History  Diagnosis Date  . Cancer     liver  . Hypertension     Patient Active Problem List   Diagnosis Date Noted  . Intractable vomiting with nausea 05/17/2015  . Intractable nausea and vomiting 05/17/2015    Past Surgical History  Procedure Laterality Date  . Prostate surgery    . Brain surgery      Current Outpatient Rx  Name  Route  Sig  Dispense  Refill  . ALPRAZolam (XANAX) 1 MG tablet   Oral   Take 1 mg by mouth every 8 (eight) hours as needed for anxiety.         . carvedilol (COREG) 6.25 MG tablet   Oral   Take 6.25 mg by mouth 2 (two) times daily.         . furosemide (LASIX) 20 MG tablet   Oral   Take 20 mg by mouth daily.         . methadone (DOLOPHINE) 10 MG tablet   Oral   Take 1 tablet (10 mg total) by mouth 3 (three) times daily.   45 tablet   0     HOSPICE PATIENT   . omeprazole (PRILOSEC) 10 MG capsule   Oral   Take 10 mg by mouth every morning.         Marland Kitchen oxyCODONE (OXY IR/ROXICODONE) 5 MG immediate release tablet      1-2 tablets every 4 hours as needed for pain Patient taking  differently: Take 5-10 mg by mouth every 8 (eight) hours as needed for severe pain.    120 tablet   0     HOSPICE PATIENT   . promethazine (PHENERGAN) 25 MG tablet   Oral   Take 25 mg by mouth every 6 (six) hours as needed for nausea or vomiting.         . simvastatin (ZOCOR) 40 MG tablet   Oral   Take 40 mg by mouth every evening.           Allergies Lisinopril; Gabapentin; and Sulfa antibiotics  No family history on file.  Social History History  Substance Use Topics  . Smoking status: Never Smoker   . Smokeless tobacco: Not on file  . Alcohol Use: No    Review of Systems Constitutional: No fever/chills Eyes: No visual changes. ENT: No sore throat. Cardiovascular: Denies chest pain. Respiratory: Denies shortness of breath. Gastrointestinal: No abdominal pain.  + nausea, no vomiting.  No diarrhea.  No constipation. Genitourinary: Negative for dysuria. Musculoskeletal: Negative for back pain. Skin: Negative for rash. Neurological: Negative for headaches, focal weakness or numbness.  10-point ROS otherwise negative.  ____________________________________________  PHYSICAL EXAM:  VITAL SIGNS: ED Triage Vitals  Enc Vitals Group     BP 05/17/15 1618 117/67 mmHg     Pulse Rate 05/17/15 1618 96     Resp 05/17/15 1618 16     Temp 05/17/15 1618 97.6 F (36.4 C)     Temp Source 05/17/15 1618 Oral     SpO2 05/17/15 1618 100 %     Weight --      Height 05/17/15 1618 5\' 7"  (1.702 m)     Head Cir --      Peak Flow --      Pain Score --      Pain Loc --      Pain Edu? --      Excl. in [place]? --     Constitutional: Alert and oriented. Chronically ill-apeearing but in no acute distress. Eyes: +scleral icterus bilaterally. PERRL. EOMI. Head: Atraumatic. Nose: No congestion/rhinnorhea. Mouth/Throat: Mucous membranes are moist.  Oropharynx non-erythematous. Neck: No stridor.   Cardiovascular: Normal rate, regular rhythm. Grossly normal heart sounds.  Good  peripheral circulation. Respiratory: Normal respiratory effort.  No retractions. Lungs CTAB. Gastrointestinal: Soft and nontender. No distention. No abdominal bruits. No CVA tenderness. Genitourinary: deferred Musculoskeletal: No lower extremity tenderness nor edema.  No joint effusions. Neurologic:  Normal speech and language. No gross focal neurologic deficits are appreciated. Speech is normal. No gait instability. Skin:  Skin is warm, dry and intact. No rash noted. Psychiatric: Mood and affect are normal. Speech and behavior are normal.  ____________________________________________   LABS (all labs ordered are listed, but only abnormal results are displayed)  Labs Reviewed  CBC - Abnormal; Notable for the following:    WBC 19.7 (*)    RBC 2.62 (*)    Hemoglobin 7.3 (*)    HCT 23.4 (*)    MCHC 31.2 (*)    RDW 20.9 (*)    All other components within normal limits  URINALYSIS COMPLETEWITH MICROSCOPIC ([facility] ONLY) - Abnormal; Notable for the following:    Color, Urine AMBER (*)    APPearance CLEAR (*)    Bilirubin Urine 1+ (*)    Hgb urine dipstick 2+ (*)    Protein, ur 30 (*)    Leukocytes, UA 2+ (*)    Squamous Epithelial / LPF 0-5 (*)    All other components within normal limits  COMPREHENSIVE METABOLIC PANEL - Abnormal; Notable for the following:    Sodium 131 (*)    Chloride 97 (*)    Glucose, Bld 102 (*)    BUN 24 (*)    Creatinine, Ser 1.52 (*)    Calcium 8.2 (*)    Albumin 2.2 (*)    AST 51 (*)    Alkaline Phosphatase 329 (*)    Total Bilirubin 6.6 (*)    GFR calc non Af Amer 47 (*)    GFR calc Af Amer 55 (*)    All other components within normal limits  TROPONIN I  LIPASE, BLOOD   ____________________________________________  EKG  ED ECG REPORT I, Joanne Gavel, the attending physician, personally viewed and interpreted this ECG.   Date: 05/17/2015  EKG Time: 16:27  Rate: 94  Rhythm: normal EKG, normal sinus rhythm  Axis: normal  Intervals:none,  normal intervals  ST&T Change: No acute ST segment change  ____________________________________________  RADIOLOGY  CT A/P IMPRESSION: Progression of liver disease, with near complete replacement of the right liver lobe, and progression in size in number of deposits within the  left liver lobe.  Interval development of inflammatory changes surrounding the gallbladder, possibly representing acute cholecystitis. Correlation with patient presentation may be useful, and potentially HIDA study if imaging is required.  Mesenteric edema with small amount of free fluid, potentially reactive given changes of liver and gallbladder.  Progression of herniation of transverse colon through a ventral wall hernia to the right of midline at the level of the liver. No evidence of obstruction.  Progression of herniation of small bowel loops through ventral wall hernia just above the umbilicus, with no evidence of obstruction.  Surgical changes of prior urostomy formation, with similar degree of pelvicaliectasis of the left collecting system and dilation of the left ureter. If there is concern for UTI, correlation with urinalysis may be useful. ____________________________________________   PROCEDURES  Procedure(s) performed: None  Critical Care performed: No  ____________________________________________   INITIAL IMPRESSION / ASSESSMENT AND PLAN / ED COURSE  Pertinent labs & imaging results that were available during my care of the patient were reviewed by me and considered in my medical decision making (see chart for details).  Marcus Haney is a 64 y.o. 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 On exam, he is chronically ill-appearing but in no acute distress. Vital signs stable, he is afebrile here. Plan for screening labs, IV fluids, possible CT abdomen and pelvis. Reassess for  disposition.  ----------------------------------------- 9:18 PM on 05/17/2015 -----------------------------------------  Patient is resting comfortably. No vomiting. Discussed CT abdomen and pelvis findings concerning for acute cholecystitis and elevated T bili with Dr. Rexene Edison who recommends medicine admission, given medical complexity, with GI and surgical consults. Discussed with hospitalist for admission. Zosyn ordered ____________________________________________   FINAL CLINICAL IMPRESSION(S) / ED DIAGNOSES  Final diagnoses:  Nausea  Cholecystitis      Joanne Gavel, MD 05/17/15 2348

## 2015-05-17 NOTE — H&P (Signed)
[facility] [place] NAME: Marcus Haney    MR#:  [id number]  DATE OF BIRTH:  Jul 15, 1951  DATE OF ADMISSION:  05/17/2015  PRIMARY CARE PHYSICIAN: No primary care provider on file.   REQUESTING/REFERRING PHYSICIAN: Dr Edd Fabian  PRIMARY ONCOLOGY DR PANDIT  CHIEF COMPLAINT:   Intractable nausea vomiting 3-4 days HISTORY OF PRESENT ILLNESS:  Marcus Haney  is a 64 y.o. 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 Patient reports vomiting 2 times in the emergency room. Denies any abdominal pain. Noted to have elevated white count of 19,000 and CT showing evidence of pericholecystic fluid suggestive of cholecystitis. Patient was also noted to have bilirubin of 6.6. Denies any fever or night sweats chills shortness of breath cough or chest pain. In the emergency room patient was started on IV fluids and received a dose of IV Zosyn. He was seen by Dr. Rexene Edison from surgery recommends medical management at present given end stage bladder cancer, severe abdominal ventral hernia.  PAST MEDICAL HISTORY:   Past Medical History  Diagnosis Date  . Cancer     liver  . Hypertension     PAST SURGICAL HISTOIRY:   Past Surgical History  Procedure Laterality Date  . Prostate surgery    . Brain surgery      SOCIAL HISTORY:   History  Substance Use Topics  . Smoking status: Never Smoker   . Smokeless tobacco: Not on file  . Alcohol Use: No    FAMILY HISTORY:  No family history on file.  DRUG ALLERGIES:   Allergies  Allergen Reactions  . Lisinopril Anaphylaxis  . Gabapentin Nausea Only  . Sulfa Antibiotics Rash    REVIEW OF SYSTEMS:  Review of Systems  Constitutional: Negative for fever, chills and diaphoresis.  HENT: Negative for congestion, ear pain, hearing loss, nosebleeds and sore throat.   Eyes: Negative for blurred vision,  double vision, photophobia and pain.  Respiratory: Negative for hemoptysis, sputum production, wheezing and stridor.   Cardiovascular: Negative for orthopnea, claudication and leg swelling.  Gastrointestinal: Positive for nausea and vomiting. Negative for heartburn and abdominal pain.  Genitourinary: Negative for dysuria and frequency.  Musculoskeletal: Negative for back pain, joint pain and neck pain.  Skin: Negative for rash.  Neurological: Positive for weakness. Negative for tingling, sensory change, speech change, focal weakness, seizures and headaches.  Endo/Heme/Allergies: Does not bruise/bleed easily.  Psychiatric/Behavioral: Negative for memory loss. The patient is not nervous/anxious.   All other systems reviewed and are negative.    MEDICATIONS AT HOME:   Prior to Admission medications   Medication Sig Start Date End Date Taking? Authorizing Provider  ALPRAZolam Duanne Moron) 1 MG tablet Take 1 mg by mouth every 8 (eight) hours as needed for anxiety.   Yes Historical Provider, MD  carvedilol (COREG) 6.25 MG tablet Take 6.25 mg by mouth 2 (two) times daily.   Yes Historical Provider, MD  furosemide (LASIX) 20 MG tablet Take 20 mg by mouth daily.   Yes Historical Provider, MD  methadone (DOLOPHINE) 10 MG tablet Take 1 tablet (10 mg total) by mouth 3 (three) times daily. 05/16/15  Yes Leia Alf, MD  omeprazole (PRILOSEC) 10 MG capsule Take 10 mg by mouth every morning.   Yes Historical Provider, MD  oxyCODONE (OXY IR/ROXICODONE) 5 MG immediate release tablet 1-2 tablets every 4 hours as needed  for pain Patient taking differently: Take 5-10 mg by mouth every 8 (eight) hours as needed for severe pain.  05/16/15  Yes Leia Alf, MD  promethazine (PHENERGAN) 25 MG tablet Take 25 mg by mouth every 6 (six) hours as needed for nausea or vomiting.   Yes Historical Provider, MD  simvastatin (ZOCOR) 40 MG tablet Take 40 mg by mouth every evening.   Yes Historical Provider, MD      VITAL  SIGNS:  Blood pressure 130/73, pulse 118, temperature 99.8 F (37.7 C), temperature source Oral, resp. rate 22, height 5\' 7"  (1.702 m), SpO2 97 %.  PHYSICAL EXAMINATION:  GENERAL:  64 y.o.-year-old chronically ill appearing patient lying in the bed with no acute distress.  EYES: Pupils equal, round, reactive to light and accommodation. +++ scleral icterus. Extraocular muscles intact.  HEENT: Head atraumatic, normocephalic. Oropharynx and nasopharynx clear.  NECK:  Supple, no jugular venous distention. No thyroid enlargement, no tenderness.  LUNGS: Normal breath sounds bilaterally, no wheezing, rales,rhonchi or crepitation. No use of accessory muscles of respiration.  CARDIOVASCULAR: S1, S2 normal. No murmurs, rubs, or gallops.  ABDOMEN: Soft, nontender, nondistended. Bowel sounds present. -[place] ventral hernia+, Urostomy bag + EXTREMITIES: No pedal edema, cyanosis, or clubbing.  NEUROLOGIC: Cranial nerves II through XII are intact. Muscle strength 4/5 in all extremities. Sensation intact. Gait not checked.  PSYCHIATRIC: The patient is alert and oriented x 3.  SKIN: No obvious rash, lesion, or ulcer.  -icterus +  LABORATORY PANEL:   CBC  Recent Labs Lab 05/17/15 1704  WBC 19.7*  HGB 7.3*  HCT 23.4*  PLT 159   Chemistries   Recent Labs Lab 05/17/15 1704  NA 131*  K 3.9  CL 97*  CO2 23  GLUCOSE 102*  BUN 24*  CREATININE 1.52*  CALCIUM 8.2*  AST 51*  ALT 34  ALKPHOS 329*  BILITOT 6.6*    Cardiac Enzymes  Recent Labs Lab 05/17/15 1704  TROPONINI 0.03   RADIOLOGY:  Ct Abdomen Pelvis W Contrast  05/17/2015   CLINICAL DATA:  64 year old male with a history of dehydration. Known history of liver carcinoma.  EXAM: CT ABDOMEN AND PELVIS WITH CONTRAST  TECHNIQUE: Multidetector CT imaging of the abdomen and pelvis was performed using the standard protocol following bolus administration of intravenous contrast.  CONTRAST:  168mL OMNIPAQUE IOHEXOL 300 MG/ML  SOLN   COMPARISON:  02/19/2015, 12/14/2014  FINDINGS: Lower chest:  Superficial soft tissues chest wall unremarkable.  Heart size within normal limits.  Catheter terminates within the right atrium, incompletely imaged.  Trace pericardial fluid/ thickening. Mediastinal lymph nodes in the lower mediastinum adjacent to the esophagus.  Small right-sided pleural effusion.  No confluent airspace disease.  Abdomen/ pelvis:  Extensive tumor involvement of the liver again demonstrated, with near complete replacement of the right liver lobe, and progression of disease within the left liver lobe. Portal vein appears to remain patent.  Unremarkable appearance of spleen.  Unremarkable appearance of pancreas.  Since the prior CT, there has been development of pericholecystic inflammatory changes/ fluid. No radiopaque gallstones present.  Unremarkable appearance of the right kidney. No hydronephrosis or nephrolithiasis. Course of the right ureter is similar to the comparison and unremarkable. Urostomy again noted, traversing the right anterior abdominal wall.  Similar appearance of the collecting system the left kidney, with pelvicaliectasis and dilation of the left ureter. This is similar when compared to the prior CT. No stones within the left-sided ureter.  Enteric contrast traverses the stomach and proximal  small bowel. No transition point identified. No abnormally distended small bowel or colon. Large stool burden.  Since the prior, there has been progression of transverse colon herniation through a ventral wall hernia to the right of midline at the level of the liver (image 39). There does not appear to be obstruction secondary to this herniation.  Just inferior there is re- demonstration of herniated small bowel loops through a ventral wall defect in the midline, progressed from the comparison. Contrast traverses, with no obstruction.  Similar compression of the cava at the level of the liver, though this appears patent.   Mesenteric edema with free fluid in the right pericolic gutter.  Vascular calcifications within the abdominal aorta and iliac vessels. No aneurysm.  No free air. Free fluid layered dependently within the lower abdomen.  Borderline enlarged lymph nodes of the bilateral inguinal regions.  No acute fracture identified.  Multilevel degenerative changes.  IMPRESSION: Progression of liver disease, with near complete replacement of the right liver lobe, and progression in size in number of deposits within the left liver lobe.  Interval development of inflammatory changes surrounding the gallbladder, possibly representing acute cholecystitis. Correlation with patient presentation may be useful, and potentially HIDA study if imaging is required.  Mesenteric edema with small amount of free fluid, potentially reactive given changes of liver and gallbladder.  Progression of herniation of transverse colon through a ventral wall hernia to the right of midline at the level of the liver. No evidence of obstruction.  Progression of herniation of small bowel loops through ventral wall hernia just above the umbilicus, with no evidence of obstruction.  Surgical changes of prior urostomy formation, with similar degree of pelvicaliectasis of the left collecting system and dilation of the left ureter. If there is concern for UTI, correlation with urinalysis may be useful.  These results were called by telephone at the time of interpretation on 05/17/2015 at 8:39 pm to Dr. Loura Pardon , who verbally acknowledged these results.  Signed,  Dulcy Fanny. Earleen Newport, DO  [facility]  [facility]   Electronically Signed   By: Corrie Mckusick D.O.   On: 05/17/2015 20:40    EKG:  NSR  IMPRESSION AND PLAN:   64 year old Mr. Issac with past medical history of stage IV metastatic bladder cancer status post urostomy bag, hypertension, hyperlipidemia comes to the emergency room with 3 day history of intractable  nausea vomiting. He is being admitted with  #1 intractable nausea vomiting with hyper-bilirubinemia Etiology could be possible due to gallbladder disease versus acute cholangitis versus CBD stone versus metastatic stage IV bladder cancer. Patient is to be admitted on the medical service. IV fluids Nothing by mouth except sips of water and iced chips with meds. Surgical consultation noted recommends medical mnx. Not a candidate for surgery at present GI consultation.  #2 leukocytosis Continue empirically IV Zosyn. Follow-up blood culture and monitor white cell count.  #3 stage IV bladder cancer Patient is followed by hospice at home. CODE STATUS addressed with patient and he is a DO NOT RESUSCITATE. Consider palliative consultation if needed. When necessary pain medicine Pt is followed by [facility] at home  #4 hypertension we'll continue his home meds.  Above was discussed with patient. No family members present. Further workup according to patient's clinical course.  All the records are reviewed and case discussed with ED provider. Management plans discussed with the patient and in agreement.  CODE STATUS: DO NOT RESUSCITATE  TOTAL TIME TAKING  CARE OF THIS PATIENT: 55 minutes.    Raelynn Corron M.D on 05/17/2015 at 11:26 PM  Between 7am to 6pm - Pager - [phone]  After 6pm go to [email or web address] - password [email or web address]  [facility]  Office  [phone]  CC: Primary care physician; No primary care provider on file.

## 2015-05-17 NOTE — ED Notes (Signed)
Pt has port a cath, awaiting room assignment to access port and draw blood

## 2015-05-18 ENCOUNTER — Ambulatory Visit: Payer: Self-pay | Admitting: Internal Medicine

## 2015-05-18 DIAGNOSIS — C7931 Secondary malignant neoplasm of brain: Secondary | ICD-10-CM

## 2015-05-18 DIAGNOSIS — G620 Drug-induced polyneuropathy: Secondary | ICD-10-CM

## 2015-05-18 DIAGNOSIS — Z515 Encounter for palliative care: Secondary | ICD-10-CM

## 2015-05-18 DIAGNOSIS — Z8546 Personal history of malignant neoplasm of prostate: Secondary | ICD-10-CM

## 2015-05-18 DIAGNOSIS — K819 Cholecystitis, unspecified: Secondary | ICD-10-CM

## 2015-05-18 DIAGNOSIS — I10 Essential (primary) hypertension: Secondary | ICD-10-CM

## 2015-05-18 DIAGNOSIS — R112 Nausea with vomiting, unspecified: Secondary | ICD-10-CM

## 2015-05-18 DIAGNOSIS — D72829 Elevated white blood cell count, unspecified: Secondary | ICD-10-CM

## 2015-05-18 DIAGNOSIS — Z86711 Personal history of pulmonary embolism: Secondary | ICD-10-CM

## 2015-05-18 DIAGNOSIS — C679 Malignant neoplasm of bladder, unspecified: Secondary | ICD-10-CM

## 2015-05-18 DIAGNOSIS — C787 Secondary malignant neoplasm of liver and intrahepatic bile duct: Secondary | ICD-10-CM

## 2015-05-18 LAB — AMMONIA: Ammonia: 10 umol/L (ref 9–35)

## 2015-05-18 MED ORDER — CARVEDILOL 6.25 MG PO TABS
6.2500 mg | ORAL_TABLET | Freq: Two times a day (BID) | ORAL | Status: DC
  Administered 2015-05-18 – 2015-05-19 (×3): 6.25 mg via ORAL
  Filled 2015-05-18 (×4): qty 1

## 2015-05-18 MED ORDER — PROCHLORPERAZINE EDISYLATE 5 MG/ML IJ SOLN: 10.0000 mg | Freq: Four times a day (QID) | INTRAMUSCULAR | Status: DC | PRN

## 2015-05-18 MED ORDER — ONDANSETRON HCL 4 MG PO TABS: 4.0000 mg | ORAL_TABLET | Freq: Four times a day (QID) | ORAL | Status: DC | PRN

## 2015-05-18 MED ORDER — METHADONE HCL 5 MG PO TABS
10.0000 mg | ORAL_TABLET | Freq: Three times a day (TID) | ORAL | Status: DC
  Administered 2015-05-18 – 2015-05-19 (×4): 10 mg via ORAL
  Filled 2015-05-18 (×4): qty 2

## 2015-05-18 MED ORDER — ALPRAZOLAM 1 MG PO TABS: 1.0000 mg | ORAL_TABLET | Freq: Three times a day (TID) | ORAL | Status: DC | PRN

## 2015-05-18 MED ORDER — SODIUM CHLORIDE 0.9 % IV SOLN
INTRAVENOUS | Status: DC
  Administered 2015-05-18 (×2): via INTRAVENOUS

## 2015-05-18 MED ORDER — PANTOPRAZOLE SODIUM 40 MG PO TBEC
40.0000 mg | DELAYED_RELEASE_TABLET | Freq: Every day | ORAL | Status: DC
  Administered 2015-05-18 – 2015-05-19 (×2): 40 mg via ORAL
  Filled 2015-05-18 (×2): qty 1

## 2015-05-18 MED ORDER — PIPERACILLIN-TAZOBACTAM 3.375 G IVPB
3.3750 g | Freq: Three times a day (TID) | INTRAVENOUS | Status: DC
  Administered 2015-05-18 – 2015-05-19 (×3): 3.375 g via INTRAVENOUS
  Filled 2015-05-18 (×7): qty 50

## 2015-05-18 MED ORDER — ONDANSETRON HCL 4 MG/2ML IJ SOLN: 4.0000 mg | Freq: Four times a day (QID) | INTRAMUSCULAR | Status: DC | PRN

## 2015-05-18 MED ORDER — SENNOSIDES-DOCUSATE SODIUM 8.6-50 MG PO TABS
2.0000 | ORAL_TABLET | Freq: Two times a day (BID) | ORAL | Status: DC
  Administered 2015-05-18 – 2015-05-19 (×3): 2 via ORAL
  Filled 2015-05-18 (×3): qty 2

## 2015-05-18 MED ORDER — PROMETHAZINE HCL 25 MG PO TABS: 25.0000 mg | ORAL_TABLET | Freq: Four times a day (QID) | ORAL | Status: DC | PRN

## 2015-05-18 MED ORDER — HEPARIN SODIUM (PORCINE) 5000 UNIT/ML IJ SOLN
5000.0000 [IU] | Freq: Three times a day (TID) | INTRAMUSCULAR | Status: DC
  Administered 2015-05-18 – 2015-05-19 (×5): 5000 [IU] via SUBCUTANEOUS
  Filled 2015-05-18 (×5): qty 1

## 2015-05-18 MED ORDER — OXYCODONE HCL 5 MG PO TABS: 5.0000 mg | ORAL_TABLET | ORAL | Status: DC | PRN

## 2015-05-18 NOTE — Consult Note (Signed)
Palliative Medicine Inpatient Consult Note   Name: ELIGIO ANGERT Date: 05/18/2015 MRN: [id number]  DOB: [birth year]  Referring Physician: Gladstone Lighter, MD  Palliative Care consult requested for this 64 y.o. 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 He was admitted 05/17/15 with 3 days intractable nausea and vomiting. Work-up significant for leukocytosis and hyperbilirubinemia and abd CT suggestive of cholecystitis. Surgery has evaluated and recommend conservative tx. At present, the pt is lying in bed. No pain or nausea at present. Family not present.     REVIEW OF SYSTEMS:  Pain: None Dyspnea:  No Nausea/Vomiting:  Yes Diarrhea:  No Constipation:   No Depression:   No Anxiety:   No Fatigue:   Yes  SOCIAL HISTORY:Pt lives alone. He is divorced and has a son who is very involved in pt's care. He is followed by hospice at home. He worked for an [employer].  reports that he has never smoked. He does not have any smokeless tobacco history on file. He reports that he does not drink alcohol or use illicit drugs.  CODE STATUS: DNR  PAST MEDICAL HISTORY: Past Medical History  Diagnosis Date  . Cancer     liver  . Hypertension     PAST SURGICAL HISTORY:  Past Surgical History  Procedure Laterality Date  . Prostate surgery    . Brain surgery      ALLERGIES:  is allergic to lisinopril; gabapentin; and sulfa antibiotics.  MEDICATIONS:  Current Facility-Administered Medications  Medication Dose Route Frequency Provider Last Rate Last Dose  . 0.9 %  sodium chloride infusion   Intravenous Continuous Fritzi Mandes, MD 100 mL/hr at 05/18/15 0120    .  ALPRAZolam (XANAX) tablet 1 mg  1 mg Oral Q8H PRN Fritzi Mandes, MD      . carvedilol (COREG) tablet 6.25 mg  6.25 mg Oral BID Fritzi Mandes, MD   6.25 mg at 05/18/15 0117  . heparin injection 5,000 Units  5,000 Units Subcutaneous 3 times per day Fritzi Mandes, MD   5,000 Units at 05/18/15 0513  . methadone (DOLOPHINE) tablet 10 mg  10 mg Oral TID Fritzi Mandes, MD      . ondansetron [facility]) tablet 4 mg  4 mg Oral Q6H PRN Fritzi Mandes, MD       Or  . ondansetron (ZOFRAN) injection 4 mg  4 mg Intravenous Q6H PRN Fritzi Mandes, MD      . oxyCODONE (Oxy IR/ROXICODONE) immediate release tablet 5 mg  5 mg Oral Q3H PRN Fritzi Mandes, MD      . pantoprazole (PROTONIX) EC tablet 40 mg  40 mg Oral Daily Fritzi Mandes, MD      . promethazine (PHENERGAN) tablet 25 mg  25 mg Oral Q6H PRN Fritzi Mandes, MD        Vital Signs: BP 107/52 mmHg  Pulse 93  Temp(Src) 98.5 F (36.9 C) (Oral)  Resp 18  Ht 5\' 7"  (1.702 m)  Wt 81.829 kg (180 lb 6.4 oz)  BMI 28.25 kg/m2  SpO2 98% Filed Weights   05/18/15 0034  Weight: 81.829 kg (180 lb 6.4 oz)    Estimated body mass index is 28.25 kg/(m^2) as  calculated from the following:   Height as of this encounter: 5\' 7"  (1.702 m).   Weight as of this encounter: 81.829 kg (180 lb 6.4 oz).  PERFORMANCE STATUS (ECOG) : 3 - Symptomatic, >50% confined to bed  PHYSICAL EXAM:  General: ill-appearing HEENT: OP clear, hearing intact Neck: Trachea midline  Cardiovascular: regular rate and rhythm Pulmonary/Chest: Clear ant fields Abdominal: soft, nontender. 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 He was admitted 05/17/15 with 3 days intractable nausea and vomiting. Work-up significant for leukocytosis and hyperbilirubinemia and abd CT suggestive of cholecystitis. Surgery has evaluated and recommend conservative tx.   At present, pt is without symptoms but is NPO. Pt was on both ondansetron and promethazine at home without relief. Will try prochlorperazine.  Pt is followed at home by [facility] which will resume at d/c. Pt is DNR.    PLAN: Compazine 10mg  IV q 6 hrs prn nausea   More than 50% of the visit was spent in counseling/coordination of care: YES  Time spent: 70 minutes

## 2015-05-18 NOTE — Plan of Care (Signed)
Problem: Discharge Progression Outcomes Goal: Discharge plan in place and appropriate Outcome: Progressing Individualization: Pt goes by Marcus Haney, lives at home, followed by hospice. Hx of htn, stage IV bladder cancer with liver mets. Pt is on home medications.

## 2015-05-18 NOTE — Progress Notes (Signed)
Initial Nutrition Assessment  DOCUMENTATION CODES:          NUTRITION DIAGNOSIS:  Inadequate oral intake related to altered GI function as evidenced by NPO status.    GOAL:   (Goal would be for diet to be progressed when medically appropriate)    MONITOR:   (Energy intake, electrolyte and renal profile, digestive system)  REASON FOR ASSESSMENT:  Malnutrition Screening Tool    ASSESSMENT:  Pt admitted with nausea, vomiting times 3 days prior to admission. GI consulted  Past Medical History  Diagnosis Date  . Cancer     liver  . Hypertension    Current Nutrition: NPO  Nutrition Prior to Admission: Pt reports for the past 5 days eating sips and bites secondary to nausea, vomiting   Labs:   Electrolyte and Renal Profile:  Recent Labs Lab 05/17/15 1704  BUN 24*  CREATININE 1.52*  NA 131*  K 3.9    Medications: NS at 166ml/hr  Nutrition-Focused physical exam completed. Findings are no fat depletion, mild muscle depletion in temple region only, and no edema.   Height:  Ht Readings from Last 1 Encounters:  05/18/15 5\' 7"  (1.702 m)    Weight:  Wt Readings from Last 1 Encounters:  05/18/15 180 lb 6.4 oz (81.829 kg)    Weight change: Pt reports weight loss but unsure how much     Wt Readings from Last 10 Encounters:  05/18/15 180 lb 6.4 oz (81.829 kg)    BMI:  Body mass index is 28.25 kg/(m^2).  Estimated Nutritional Needs:  Kcal:  BEE 1573 kcals (IF 1.1-1.3, AF 1.3) 2992-4268 kcals/d.   Protein:  (1.0-1.3 g/d) 82-107 g/d  Fluid:  (25-61ml/kg) 2050-2459ml/d  Skin:  Reviewed, no issues  Diet Order:  Diet NPO time specified Except for: Sips with Meds  EDUCATION NEEDS:  No education needs identified at this time   Intake/Output Summary (Last 24 hours) at 05/18/15 1304 Last data filed at 05/18/15 1042  Gross per 24 hour  Intake      0 ml  Output    800 ml  Net   -800 ml    Last BM:  6/7 MODERATE Care Level Jantz Main B. Zenia Resides,  [place], [place] (pager)

## 2015-05-18 NOTE — Consult Note (Signed)
GI Inpatient Consult Note  Reason for Consult: Intractable N/V with hyperbilirubinemia   Attending Requesting Consult: Fritzi Mandes, MD  History of Present Illness: Marcus Haney is a 64 y.o. male. He reports nausea and vomiting for the past 3 days. 2-3 episodes per day of clear thin vomit. Last episode was yesterday. Denies diarrhea, abdominal pain, fever, night sweats, chills, shortness of breath or chest pain. Last bowel movement was 3 days ago, which patient states is normal for him. Patient states that he is feels better, and is starting to get an appetite back.  Past Medical History:  Past Medical History  Diagnosis Date  . Cancer     liver  . Hypertension     Problem List: Patient Active Problem List   Diagnosis Date Noted  . Intractable vomiting with nausea 05/17/2015  . Intractable nausea and vomiting 05/17/2015    Past Surgical History: Past Surgical History  Procedure Laterality Date  . Prostate surgery    . Brain surgery      Allergies: Allergies  Allergen Reactions  . Lisinopril Anaphylaxis  . Gabapentin Nausea Only  . Sulfa Antibiotics Rash    Home Medications: Prescriptions prior to admission  Medication Sig Dispense Refill Last Dose  . ALPRAZolam (XANAX) 1 MG tablet Take 1 mg by mouth every 8 (eight) hours as needed for anxiety.   PRN at PRN  . carvedilol (COREG) 6.25 MG tablet Take 6.25 mg by mouth 2 (two) times daily.   unknown at unknown  . furosemide (LASIX) 20 MG tablet Take 20 mg by mouth daily.   unknown at unknown  . methadone (DOLOPHINE) 10 MG tablet Take 1 tablet (10 mg total) by mouth 3 (three) times daily. 45 tablet 0 unknown at unknown  . omeprazole (PRILOSEC) 10 MG capsule Take 10 mg by mouth every morning.   unknown at unknown  . oxyCODONE (OXY IR/ROXICODONE) 5 MG immediate release tablet 1-2 tablets every 4 hours as needed for pain (Patient taking differently: Take 5-10 mg by mouth every 8 (eight) hours as needed for severe pain. ) 120 tablet 0  PRN at PRN  . promethazine (PHENERGAN) 25 MG tablet Take 25 mg by mouth every 6 (six) hours as needed for nausea or vomiting.   PRN at PRN  . simvastatin (ZOCOR) 40 MG tablet Take 40 mg by mouth every evening.   unknown at unknown   Home medication reconciliation was completed with the patient.   Scheduled Inpatient Medications:   . carvedilol  6.25 mg Oral BID  . heparin  5,000 Units Subcutaneous 3 times per day  . methadone  10 mg Oral TID  . pantoprazole  40 mg Oral Daily    Continuous Inpatient Infusions:   . sodium chloride 100 mL/hr at 05/18/15 0120    PRN Inpatient Medications:  ALPRAZolam, ondansetron **OR** ondansetron (ZOFRAN) IV, oxyCODONE, promethazine  Family History: The patient's family history is negative for inflammatory bowel disorders, GI malignancy, or solid organ transplantation.  Social History:   reports that he has never smoked. He does not have any smokeless tobacco history on file. He reports that he does not drink alcohol or use illicit drugs.   Review of Systems: Constitutional: negative for fever, chills  Eyes: No changes in vision. ENT: No oral lesions, sore throat.  GI: see HPI.  Heme/Lymph: No easy bruising.  CV: No chest pain.  Integumentary: No rashes.  Neuro: No headaches.  Psych: No depression/anxiety.  Endocrine: No heat/cold intolerance.  Allergic/Immunologic: No  urticaria.  Resp: No cough, SOB.  Musculoskeletal: No joint swelling.    Physical Examination: BP 107/52 mmHg  Pulse 93  Temp(Src) 98.5 F (36.9 C) (Oral)  Resp 18  Ht 5\' 7"  (1.702 m)  Wt 81.829 kg (180 lb 6.4 oz)  BMI 28.25 kg/m2  SpO2 98% KCL:EXNTZGYF chronically ill appearing patient, alert and oriented, in no acute distress  HEENT: Sclera icterus  Neck: supple, no JVD or thyromegaly Chest: CTA bilaterally, no wheezes, crackles, or other adventitious sounds CV: RRR, no m/g/c/r Abd: soft, NT, ND, +BS in all four quadrants; no HSM, guarding, ridigity, or rebound  tenderness. Urostomy bag present.  Ext: Slight bilateral lower extremity  edema, well perfused with 2+ pulses, Skin: no rash or lesions noted. Slight icterus noted  Lymph: no LAD  Data: Lab Results  Component Value Date   WBC 19.7* 05/17/2015   HGB 7.3* 05/17/2015   HCT 23.4* 05/17/2015   MCV 89.5 05/17/2015   PLT 159 05/17/2015    Recent Labs Lab 05/17/15 1704  HGB 7.3*   Lab Results  Component Value Date   NA 131* 05/17/2015   K 3.9 05/17/2015   CL 97* 05/17/2015   CO2 23 05/17/2015   BUN 24* 05/17/2015   CREATININE 1.52* 05/17/2015   Lab Results  Component Value Date   ALT 34 05/17/2015   AST 51* 05/17/2015   ALKPHOS 329* 05/17/2015   BILITOT 6.6* 05/17/2015   No results for input(s): APTT, INR, PTT in the last 168 hours.  Imaging:  CT of abdomen: Impression: Progression of liver disease, with near complete replacement of the right liver lobe, and progression in size and number of deposits within the left liver lobe.  Interval development of inflammatory changes surrounding the gallbladder, possibly representing acute cholecystitis. Correlation with patient's presentation may be useful, and potentially HIDA study if imaging is requiring.  Mesenteric edema with small amount of free fluid, potentially reactive given changes of liver and gallbladder.  Progression of herniation of transverse colon through a ventral wall hernia to the right of midline at the level of the liver. No evidence of obstruction.  Progression of herniation of small bowel loops through ventral wall hernia just above the umbilicus with no evidence of obstruction.  Surgical changes of prior urostomy formation, with similar degree of pelvicaliectasis of the left collecting system and dilation of the left ureter. If there is concern for UTI, correlation with urinalysis may be useful.  Assessment/Plan: Marcus Haney is a 64 y.o. male  Intractable N/V with hyperbilirubiemia Patient has been  discussed with Dr. Vira Agar, and we do not have any further recommendations at this time since most of the liver has been replaced by tissue.  Nausea/ Vomiting has subsided   Recommendations:  Thank you for the consult. Please call with questions or concerns.  Salvadore Farber, PA-C  This case was discussed with Dr. Manya Silvas in collaboration of care.  These services were provided by Mitchell Epling S. Celesta Aver, PA-C under collaborative practice agreement with Manya Silvas, MD.

## 2015-05-18 NOTE — Progress Notes (Signed)
ANTIBIOTIC CONSULT NOTE - INITIAL  Pharmacy Consult for Zosyn Indication: cholecystitis  Allergies  Allergen Reactions  . Lisinopril Anaphylaxis  . Gabapentin Nausea Only  . Sulfa Antibiotics Rash    Patient Measurements: Height: 5\' 7"  (170.2 cm) Weight: 180 lb 6.4 oz (81.829 kg) IBW/kg (Calculated) : 66.1 Adjusted Body Weight: n/a  Vital Signs: Temp: 98.9 F (37.2 C) (06/10 1330) Temp Source: Oral (06/10 1330) BP: 100/57 mmHg (06/10 1330) Pulse Rate: 86 (06/10 1330) Intake/Output from previous day: 06/09 0701 - 06/10 0700 In: -  Out: 650 [Urine:650] Intake/Output from this shift: Total I/O In: -  Out: 150 [Urine:150]  Labs:  Recent Labs  05/17/15 1704  WBC 19.7*  HGB 7.3*  PLT 159  CREATININE 1.52*   Estimated Creatinine Clearance: 50.9 mL/min (by C-G formula based on Cr of 1.52). No results for input(s): VANCOTROUGH, VANCOPEAK, VANCORANDOM, GENTTROUGH, GENTPEAK, GENTRANDOM, TOBRATROUGH, TOBRAPEAK, TOBRARND, AMIKACINPEAK, AMIKACINTROU, AMIKACIN in the last 72 hours.   Microbiology: No results found for this or any previous visit (from the past 720 hour(s)).  Medical History: Past Medical History  Diagnosis Date  . Cancer     liver  . Hypertension     Medications:  Anti-infectives    Start     Dose/Rate Route Frequency Ordered Stop   05/18/15 1415  piperacillin-tazobactam (ZOSYN) IVPB 3.375 g     3.375 g 12.5 mL/hr over 240 Minutes Intravenous 3 times per day 05/18/15 1408     05/17/15 2130  piperacillin-tazobactam (ZOSYN) IVPB 3.375 g     3.375 g 12.5 mL/hr over 240 Minutes Intravenous  Once 05/17/15 2115 05/17/15 2252     Assessment: Patient admitted for intractable N/V with possible cholecystitis to be managed medically. Empirically started on Zosyn.  Goal of Therapy:  Resolution of symptoms  Plan:  Follow up culture results Zosyn 3.375g IV EI q8h  Paulina Fusi, PharmD, BCPS 05/18/2015 2:36 PM

## 2015-05-18 NOTE — Consult Note (Signed)
Given his almost total replacement of his liver with tumor tissue on the CT I see nothing we can offer him at this time and will sign off.

## 2015-05-18 NOTE — Plan of Care (Signed)
Problem: Discharge Progression Outcomes Goal: Discharge plan in place and appropriate Outcome: Progressing Individualization: Pt goes by Marcus Haney, lives at home, followed by hospice. Hx of htn, stage IV bladder cancer with liver mets. Pt is on home medications.  Goal: Other Discharge Outcomes/Goals Outcome: Progressing Pt alert, but hoh creates a barrier when asking him questions. Pt is lethargic, does not complain of pain, nausea. Pt is bed bound due to generalized weakness, he states his baseline is he gets around at times with a walker. Urostomy bag on rt side of abd draining tea colored urine. Will continue to monitor.

## 2015-05-18 NOTE — Progress Notes (Signed)
Home at [place] NAME: Marcus Haney    MR#:  [id number]  DATE OF BIRTH:  November 01, 1951  SUBJECTIVE:  CHIEF COMPLAINT:   Chief Complaint  Patient presents with  . Weakness  -patient admitted with nausea vomiting and abdominal pain. -Noted to have acute cholecystitis and also worsening of his liver metastases. -Due to his stage IV metastatic cancer, not a surgical candidate. Conservative management recommended. His nausea is improving today.  REVIEW OF SYSTEMS:  Review of Systems  Constitutional: Negative for fever and chills.  Respiratory: Negative for cough, shortness of breath and wheezing.   Cardiovascular: Negative for chest pain and palpitations.  Gastrointestinal: Positive for nausea and abdominal pain. Negative for vomiting, diarrhea and constipation.  Genitourinary: Negative for dysuria.  Neurological: Negative for dizziness, seizures and headaches.    DRUG ALLERGIES:   Allergies  Allergen Reactions  . Lisinopril Anaphylaxis  . Gabapentin Nausea Only  . Sulfa Antibiotics Rash    VITALS:  Blood pressure 100/57, pulse 86, temperature 98.9 F (37.2 C), temperature source Oral, resp. rate 18, height 5\' 7"  (1.702 m), weight 81.829 kg (180 lb 6.4 oz), SpO2 100 %.  PHYSICAL EXAMINATION:  Physical Exam  GENERAL:  64 y.o.-year-old patient lying in the bed with no acute distress.  EYES: Pupils equal, round, reactive to light and accommodation. Jaundiced sclerae. Extraocular muscles intact.  HEENT: Head atraumatic, normocephalic. Oropharynx and nasopharynx clear.  NECK:  Supple, no jugular venous distention. No thyroid enlargement, no tenderness.  LUNGS: Normal breath sounds bilaterally, no wheezing, rales,rhonchi or crepitation. No use of accessory muscles of respiration.  CARDIOVASCULAR: S1, S2 normal. No murmurs, rubs, or gallops.  ABDOMEN: Soft, distended abdomen, ventral hernia noted. There is urostomy bag noted in  right lower quadrant. Bowel sounds present. No organomegaly or mass.  EXTREMITIES: No pedal edema, cyanosis, or clubbing.  NEUROLOGIC: Cranial nerves II through XII are intact. Muscle strength 5/5 in all extremities. Sensation intact. Gait not checked.  PSYCHIATRIC: The patient is alert and oriented x 3.  SKIN: No obvious rash, lesion, or ulcer.    LABORATORY PANEL:   CBC  Recent Labs Lab 05/17/15 1704  WBC 19.7*  HGB 7.3*  HCT 23.4*  PLT 159   ------------------------------------------------------------------------------------------------------------------  Chemistries   Recent Labs Lab 05/17/15 1704  NA 131*  K 3.9  CL 97*  CO2 23  GLUCOSE 102*  BUN 24*  CREATININE 1.52*  CALCIUM 8.2*  AST 51*  ALT 34  ALKPHOS 329*  BILITOT 6.6*   ------------------------------------------------------------------------------------------------------------------  Cardiac Enzymes  Recent Labs Lab 05/17/15 1704  TROPONINI 0.03   ------------------------------------------------------------------------------------------------------------------  RADIOLOGY:  Ct Abdomen Pelvis W Contrast  05/17/2015   CLINICAL DATA:  64 year old male with a history of dehydration. Known history of liver carcinoma.  EXAM: CT ABDOMEN AND PELVIS WITH CONTRAST  TECHNIQUE: Multidetector CT imaging of the abdomen and pelvis was performed using the standard protocol following bolus administration of intravenous contrast.  CONTRAST:  191mL OMNIPAQUE IOHEXOL 300 MG/ML  SOLN  COMPARISON:  02/19/2015, 12/14/2014  FINDINGS: Lower chest:  Superficial soft tissues chest wall unremarkable.  Heart size within normal limits.  Catheter terminates within the right atrium, incompletely imaged.  Trace pericardial fluid/ thickening. Mediastinal lymph nodes in the lower mediastinum adjacent to the esophagus.  Small right-sided pleural effusion.  No confluent airspace disease.  Abdomen/ pelvis:  Extensive tumor involvement of the  liver again demonstrated, with near complete replacement of the right liver lobe, and  progression of disease within the left liver lobe. Portal vein appears to remain patent.  Unremarkable appearance of spleen.  Unremarkable appearance of pancreas.  Since the prior CT, there has been development of pericholecystic inflammatory changes/ fluid. No radiopaque gallstones present.  Unremarkable appearance of the right kidney. No hydronephrosis or nephrolithiasis. Course of the right ureter is similar to the comparison and unremarkable. Urostomy again noted, traversing the right anterior abdominal wall.  Similar appearance of the collecting system the left kidney, with pelvicaliectasis and dilation of the left ureter. This is similar when compared to the prior CT. No stones within the left-sided ureter.  Enteric contrast traverses the stomach and proximal small bowel. No transition point identified. No abnormally distended small bowel or colon. Large stool burden.  Since the prior, there has been progression of transverse colon herniation through a ventral wall hernia to the right of midline at the level of the liver (image 39). There does not appear to be obstruction secondary to this herniation.  Just inferior there is re- demonstration of herniated small bowel loops through a ventral wall defect in the midline, progressed from the comparison. Contrast traverses, with no obstruction.  Similar compression of the cava at the level of the liver, though this appears patent.  Mesenteric edema with free fluid in the right pericolic gutter.  Vascular calcifications within the abdominal aorta and iliac vessels. No aneurysm.  No free air. Free fluid layered dependently within the lower abdomen.  Borderline enlarged lymph nodes of the bilateral inguinal regions.  No acute fracture identified.  Multilevel degenerative changes.  IMPRESSION: Progression of liver disease, with near complete replacement of the right liver lobe, and  progression in size in number of deposits within the left liver lobe.  Interval development of inflammatory changes surrounding the gallbladder, possibly representing acute cholecystitis. Correlation with patient presentation may be useful, and potentially HIDA study if imaging is required.  Mesenteric edema with small amount of free fluid, potentially reactive given changes of liver and gallbladder.  Progression of herniation of transverse colon through a ventral wall hernia to the right of midline at the level of the liver. No evidence of obstruction.  Progression of herniation of small bowel loops through ventral wall hernia just above the umbilicus, with no evidence of obstruction.  Surgical changes of prior urostomy formation, with similar degree of pelvicaliectasis of the left collecting system and dilation of the left ureter. If there is concern for UTI, correlation with urinalysis may be useful.  These results were called by telephone at the time of interpretation on 05/17/2015 at 8:39 pm to Dr. Loura Pardon , who verbally acknowledged these results.  Signed,  Dulcy Fanny. Earleen Newport, DO  [facility]  [facility]   Electronically Signed   By: Corrie Mckusick D.O.   On: 05/17/2015 20:40    EKG:   Orders placed or performed during the hospital encounter of 05/17/15  . ED EKG  . ED EKG  . EKG 12-Lead  . 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 -Not a surgical candidate at this time. GI has been consulted. -Surgery following the patient. Continue IV antibiotics with Zosyn at this time. Since symptoms are improving started on clear liquids today. \  #  2 jaundice-likely secondary to worsening  liver metastases, possible cholecystitis. Not sure if there is an obstruction to CBD present. -Follow GI recommendations -On clear liquid diet today. Recheck in morning.  #3 stage IV bladder cancer-with liver metastases, worsening as noted on CT scan. 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 Appreciate palliative care consult. Patient is followed by hospice at home. Once his symptoms are improved he is to be discharged home with hospice services  #4 hypertension on Coreg  #5 DVT prophylaxis-on subcutaneous heparin   All the records are reviewed and case discussed with Care Management/Social Workerr. Management plans discussed with the patient, family and they are in agreement.  CODE STATUS: DO NOT RESUSCITATE  TOTAL TIME TAKING CARE OF THIS PATIENT: 40 minutes.   POSSIBLE D/C IN 1-2 DAYS, DEPENDING ON CLINICAL CONDITION.   Gladstone Lighter M.D on 05/18/2015 at 1:38 PM  Between 7am to 6pm - Pager - [phone]  After 6pm go to [email or web address] - password [email or web address]  [place] [facility]  Office  [phone]  CC: Primary care physician; No primary care provider on file.

## 2015-05-18 NOTE — Care Management (Signed)
Admitted to [facility] with the diagnosis of intractable vomiting. Lives alone. Sister is Rod Holler [phone]), Followed by [facility] x 1 month. No other home health in the past. No skilled facility. No home oxygen. Uses a rolling walker to aid in ambulation. Doesn't drive anymore. Son Paramedic will transport. Takes care of all activities of daily living himself.  Shelbie Ammons RN MSN Care Management [phone]

## 2015-05-18 NOTE — Progress Notes (Signed)
Follow up on current hospice patient who was admitted on 6.9.16 at 2338 with Dx- intractable nausea/vomiting.  Patient has been seen by palliative medicine who recommends compazine 10mg  IV q 6 hours prn for nausea.   Patient's hospital significant for leukocytosis and hyperbilirubinemia.  CT scan suggestive of cholecystitis.  Patient was seen by GI who states due to CT results- liver completely consumed by tumor tissue, they have nothing to offer.  Nausea and vomiting is better this afternoon.  Team at [facility] aware that patient is currently followed by [facility] with report given.  Updated information faxed to referral intake.  No plans for discharge.   Dimas Aguas, RN Clinical Nurse Liaison [facility]

## 2015-05-18 NOTE — Plan of Care (Signed)
Problem: Discharge Progression Outcomes Goal: Other Discharge Outcomes/Goals Outcome: Progressing Pt tolerating medications and diet advanced today to clear liquids, tolerated well Senna ordered, bo bm x2 days and stomach is distended. Bowel sounds hypoactive Ammonia level draw, 10 wnl Will continue to monitor

## 2015-05-18 NOTE — Progress Notes (Signed)
I have nothing to add to Dr. Melvenia Needles note yesterday. The patient is quite comfortable with no pain and very minimal nausea, which is not significantly affecting his ability to rest. No surgical indications at this time.

## 2015-05-19 DIAGNOSIS — C787 Secondary malignant neoplasm of liver and intrahepatic bile duct: Secondary | ICD-10-CM

## 2015-05-19 DIAGNOSIS — C7911 Secondary malignant neoplasm of bladder: Secondary | ICD-10-CM

## 2015-05-19 DIAGNOSIS — K810 Acute cholecystitis: Secondary | ICD-10-CM

## 2015-05-19 LAB — COMPREHENSIVE METABOLIC PANEL
ALT: 27 U/L (ref 17–63)
AST: 37 U/L (ref 15–41)
Albumin: 1.8 g/dL — ABNORMAL LOW (ref 3.5–5.0)
Alkaline Phosphatase: 264 U/L — ABNORMAL HIGH (ref 38–126)
Anion gap: 8 (ref 5–15)
BUN: 21 mg/dL — ABNORMAL HIGH (ref 6–20)
CHLORIDE: 103 mmol/L (ref 101–111)
CO2: 21 mmol/L — ABNORMAL LOW (ref 22–32)
Calcium: 7.6 mg/dL — ABNORMAL LOW (ref 8.9–10.3)
Creatinine, Ser: 1.28 mg/dL — ABNORMAL HIGH (ref 0.61–1.24)
GFR calc Af Amer: >60 mL/min (ref >60)
GFR calc non Af Amer: 58 mL/min — ABNORMAL LOW (ref >60)
Glucose, Bld: 79 mg/dL (ref 65–99)
Potassium: 3.8 mmol/L (ref 3.5–5.1)
Sodium: 132 mmol/L — ABNORMAL LOW (ref 135–145)
Total Bilirubin: 7.1 mg/dL — ABNORMAL HIGH (ref 0.3–1.2)
Total Protein: 6.4 g/dL — ABNORMAL LOW (ref 6.5–8.1)

## 2015-05-19 LAB — CBC
HCT: 20.6 % — ABNORMAL LOW (ref 40.0–52.0)
HEMOGLOBIN: 6.5 g/dL — AB (ref 13.0–18.0)
MCH: 28 pg (ref 26.0–34.0)
MCHC: 31.7 g/dL — ABNORMAL LOW (ref 32.0–36.0)
MCV: 88.5 fL (ref 80.0–100.0)
PLATELETS: 138 10*3/uL — AB (ref 150–440)
RBC: 2.33 MIL/uL — ABNORMAL LOW (ref 4.40–5.90)
RDW: 19.7 % — ABNORMAL HIGH (ref 11.5–14.5)
WBC: 17.9 10*3/uL — ABNORMAL HIGH (ref 3.8–10.6)

## 2015-05-19 LAB — ABO/RH: ABO/RH(D): B POS

## 2015-05-19 LAB — PREPARE RBC (CROSSMATCH)

## 2015-05-19 MED ORDER — METRONIDAZOLE 500 MG PO TABS: 500.0000 mg | ORAL_TABLET | Freq: Three times a day (TID) | ORAL | Status: AC

## 2015-05-19 MED ORDER — PROCHLORPERAZINE MALEATE 10 MG PO TABS: 10.0000 mg | ORAL_TABLET | Freq: Four times a day (QID) | ORAL | Status: AC | PRN

## 2015-05-19 MED ORDER — SODIUM CHLORIDE 0.9 % IV SOLN
Freq: Once | INTRAVENOUS | Status: AC
  Administered 2015-05-19: 12:00:00 via INTRAVENOUS

## 2015-05-19 MED ORDER — LEVOFLOXACIN 500 MG PO TABS: 500.0000 mg | ORAL_TABLET | Freq: Every day | ORAL | Status: AC

## 2015-05-19 NOTE — Care Management Note (Addendum)
Case Management Note  Patient Details  Name: Marcus Haney MRN: [id number] Date of Birth: 03-Oct-1951  Subjective/Objective:      Referral to resume hospice services faxed and called to [place] at [facility].                  Expected Discharge Date:                  Expected Discharge Plan:     In-House Referral:     Discharge planning Services     Post Acute Care Choice:    Choice offered to:     DME Arranged:    DME Agency:     HH Arranged:    [place] Agency:     Status of Service:     Medicare Important Message Given:    Date Medicare IM Given:    Medicare IM give by:    Date Additional Medicare IM Given:    Additional Medicare Important Message give by:     If discussed at [place] of Stay Meetings, dates discussed:    Additional Comments:  Tanija Germani A, RN 05/19/2015, 10:56 AM

## 2015-05-19 NOTE — Plan of Care (Signed)
Problem: Discharge Progression Outcomes Goal: Discharge plan in place and appropriate Outcome: Progressing  Individualization: Pt goes by Marcus Haney, lives at home, followed by hospice. Hx of htn, stage IV bladder cancer with liver mets. Pt is on home medications.      Goal: Other Discharge Outcomes/Goals Outcome: Progressing Plan of care progress to goal :  Discharge plan: per pt plan to go home and continue with hospice care  Pain: no complaints of pain this shift  Hemo: awaiting labs from his morning, VSS IVF infusing per md order  Diet : eating bites

## 2015-05-19 NOTE — Discharge Instructions (Signed)
°  DIET:  Full Liquid Advance to regular diet in 2 days  DISCHARGE CONDITION:  Fair  ACTIVITY:  Activity as tolerated  OXYGEN:  Home Oxygen: No.   Oxygen Delivery: room air  DISCHARGE LOCATION:  Home with hospice services   If you experience worsening of your admission symptoms, develop shortness of breath, life threatening emergency, suicidal or homicidal thoughts you must seek medical attention immediately by calling 911 or calling your MD immediately  if symptoms less severe.  You Must read complete instructions/literature along with all the possible adverse reactions/side effects for all the Medicines you take and that have been prescribed to you. Take any new Medicines after you have completely understood and accpet all the possible adverse reactions/side effects.   Please note  You were cared for by a hospitalist during your hospital stay. If you have any questions about your discharge medications or the care you received while you were in the hospital after you are discharged, you can call the unit and asked to speak with the hospitalist on call if the hospitalist that took care of you is not available. Once you are discharged, your primary care physician will handle any further medical issues. Please note that NO REFILLS for any discharge medications will be authorized once you are discharged, as it is imperative that you return to your primary care physician (or establish a relationship with a primary care physician if you do not have one) for your aftercare needs so that they can reassess your need for medications and monitor your lab values.

## 2015-05-19 NOTE — Progress Notes (Signed)
Resume hospice in-home services. V.O. Dr Gladstone Lighter / Leda Gauze, RN, BSN, Case Manager

## 2015-05-19 NOTE — Progress Notes (Signed)
[place] at [place] NAME: Marcus Haney    MR#:  [id number]  DATE OF BIRTH:  31-Jul-1951  SUBJECTIVE:  CHIEF COMPLAINT:   Chief Complaint  Patient presents with  . Weakness  --Noted to have acute cholecystitis and also worsening of his liver metastases. -Due to his stage IV metastatic cancer, not a surgical candidate. Conservative management recommended. - Improved nausea/vomiting. Tolerating clear liquids well  REVIEW OF SYSTEMS:  Review of Systems  Constitutional: Negative for fever and chills.  Respiratory: Negative for cough, shortness of breath and wheezing.   Cardiovascular: Negative for chest pain and palpitations.  Gastrointestinal: Positive for abdominal pain. Negative for nausea, vomiting, diarrhea and constipation.  Genitourinary: Negative for dysuria.  Neurological: Negative for dizziness, seizures and headaches.    DRUG ALLERGIES:   Allergies  Allergen Reactions  . Lisinopril Anaphylaxis  . Gabapentin Nausea Only  . Sulfa Antibiotics Rash    VITALS:  Blood pressure 102/56, pulse 107, temperature 99 F (37.2 C), temperature source Oral, resp. rate 18, height 5\' 7"  (1.702 m), weight 81.829 kg (180 lb 6.4 oz), SpO2 98 %.  PHYSICAL EXAMINATION:  Physical Exam  GENERAL:  64 y.o.-year-old patient lying in the bed with no acute distress.  EYES: Pupils equal, round, reactive to light and accommodation. Jaundiced sclerae. Extraocular muscles intact.  HEENT: Head atraumatic, normocephalic. Oropharynx and nasopharynx clear.  NECK:  Supple, no jugular venous distention. No thyroid enlargement, no tenderness.  LUNGS: Normal breath sounds bilaterally, no wheezing, rales,rhonchi or crepitation. No use of accessory muscles of respiration.  CARDIOVASCULAR: S1, S2 normal. No murmurs, rubs, or gallops.  ABDOMEN: Soft, distended abdomen, ventral hernia noted. There is urostomy bag noted in right lower quadrant. Bowel sounds  present. No organomegaly or mass.  EXTREMITIES: No pedal edema, cyanosis, or clubbing.  NEUROLOGIC: Cranial nerves II through XII are intact. Muscle strength 5/5 in all extremities. Sensation intact. Gait not checked.  PSYCHIATRIC: The patient is alert and oriented x 3.  SKIN: No obvious rash, lesion, or ulcer.    LABORATORY PANEL:   CBC  Recent Labs Lab 05/19/15 0503  WBC 17.9*  HGB 6.5*  HCT 20.6*  PLT 138*   ------------------------------------------------------------------------------------------------------------------  Chemistries   Recent Labs Lab 05/19/15 0503  NA 132*  K 3.8  CL 103  CO2 21*  GLUCOSE 79  BUN 21*  CREATININE 1.28*  CALCIUM 7.6*  AST 37  ALT 27  ALKPHOS 264*  BILITOT 7.1*   ------------------------------------------------------------------------------------------------------------------  Cardiac Enzymes  Recent Labs Lab 05/17/15 1704  TROPONINI 0.03   ------------------------------------------------------------------------------------------------------------------  RADIOLOGY:  Ct Abdomen Pelvis W Contrast  05/17/2015   CLINICAL DATA:  64 year old male with a history of dehydration. Known history of liver carcinoma.  EXAM: CT ABDOMEN AND PELVIS WITH CONTRAST  TECHNIQUE: Multidetector CT imaging of the abdomen and pelvis was performed using the standard protocol following bolus administration of intravenous contrast.  CONTRAST:  145mL OMNIPAQUE IOHEXOL 300 MG/ML  SOLN  COMPARISON:  02/19/2015, 12/14/2014  FINDINGS: Lower chest:  Superficial soft tissues chest wall unremarkable.  Heart size within normal limits.  Catheter terminates within the right atrium, incompletely imaged.  Trace pericardial fluid/ thickening. Mediastinal lymph nodes in the lower mediastinum adjacent to the esophagus.  Small right-sided pleural effusion.  No confluent airspace disease.  Abdomen/ pelvis:  Extensive tumor involvement of the liver again demonstrated, with near  complete replacement of the right liver lobe, and progression of disease within the left liver  lobe. Portal vein appears to remain patent.  Unremarkable appearance of spleen.  Unremarkable appearance of pancreas.  Since the prior CT, there has been development of pericholecystic inflammatory changes/ fluid. No radiopaque gallstones present.  Unremarkable appearance of the right kidney. No hydronephrosis or nephrolithiasis. Course of the right ureter is similar to the comparison and unremarkable. Urostomy again noted, traversing the right anterior abdominal wall.  Similar appearance of the collecting system the left kidney, with pelvicaliectasis and dilation of the left ureter. This is similar when compared to the prior CT. No stones within the left-sided ureter.  Enteric contrast traverses the stomach and proximal small bowel. No transition point identified. No abnormally distended small bowel or colon. Large stool burden.  Since the prior, there has been progression of transverse colon herniation through a ventral wall hernia to the right of midline at the level of the liver (image 39). There does not appear to be obstruction secondary to this herniation.  Just inferior there is re- demonstration of herniated small bowel loops through a ventral wall defect in the midline, progressed from the comparison. Contrast traverses, with no obstruction.  Similar compression of the cava at the level of the liver, though this appears patent.  Mesenteric edema with free fluid in the right pericolic gutter.  Vascular calcifications within the abdominal aorta and iliac vessels. No aneurysm.  No free air. Free fluid layered dependently within the lower abdomen.  Borderline enlarged lymph nodes of the bilateral inguinal regions.  No acute fracture identified.  Multilevel degenerative changes.  IMPRESSION: Progression of liver disease, with near complete replacement of the right liver lobe, and progression in size in number of  deposits within the left liver lobe.  Interval development of inflammatory changes surrounding the gallbladder, possibly representing acute cholecystitis. Correlation with patient presentation may be useful, and potentially HIDA study if imaging is required.  Mesenteric edema with small amount of free fluid, potentially reactive given changes of liver and gallbladder.  Progression of herniation of transverse colon through a ventral wall hernia to the right of midline at the level of the liver. No evidence of obstruction.  Progression of herniation of small bowel loops through ventral wall hernia just above the umbilicus, with no evidence of obstruction.  Surgical changes of prior urostomy formation, with similar degree of pelvicaliectasis of the left collecting system and dilation of the left ureter. If there is concern for UTI, correlation with urinalysis may be useful.  These results were called by telephone at the time of interpretation on 05/17/2015 at 8:39 pm to Dr. Loura Pardon , who verbally acknowledged these results.  Signed,  Dulcy Fanny. Earleen Newport, DO  [facility]  [facility]   Electronically Signed   By: Corrie Mckusick D.O.   On: 05/17/2015 20:40    EKG:   Orders placed or performed during the hospital encounter of 05/17/15  . ED EKG  . ED EKG  . EKG 12-Lead  . 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 -Not a surgical candidate at this time. GI has been consulted. -Surgery following the patient. Continue IV antibiotics with Zosyn at this time.change to flagyl adn levaquin at discharge Since symptoms are improving - advance diet Patient can be discharged  home with hospice  #2  Jaundice-likely secondary to worsening liver metastases, possible cholecystitis.  - Appreciate GI consult- extensive liver disease due to metastases- per GI no further treatment -On clear liquid diet - worsening jaundice- but symptoms improving- so advance diet  #3 stage IV bladder cancer-with liver metastases, worsening as noted on CT scan. 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 Appreciate palliative care consult. Patient is followed by hospice at home. Once his symptoms are improved he is to be discharged home with hospice services  #4 hypertension on Coreg  #5 Acute on chronic anemia- due to malignancy, transfuse 1unit prior to discharge  #5 DVT prophylaxis-on subcutaneous heparin  All the records are reviewed and case discussed with Care Management/Social Workerr. Management plans discussed with the patient, family and they are in agreement.  CODE STATUS: DO NOT RESUSCITATE  TOTAL TIME TAKING CARE OF THIS PATIENT: 40 minutes.   POSSIBLE D/C TODAY, DEPENDING ON CLINICAL CONDITION.   Gladstone Lighter M.D on 05/19/2015 at 10:32 AM  Between 7am to 6pm - Pager - [phone]  After 6pm go to [email or web address] - password [email or web address] [facility]  [place] [facility]  Office  [phone]  CC: Primary care physician; No primary care provider on file.

## 2015-05-19 NOTE — Plan of Care (Signed)
Problem: Discharge Progression Outcomes Goal: Other Discharge Outcomes/Goals Outcome: Progressing Patient discharged home per MD order. VSS. No complications with blood transfusion. All discharge instructions given and all questions answered. Escorted home by son.

## 2015-05-19 NOTE — Discharge Summary (Signed)
[place] [facility] [place] NAME: Marcus Haney    MR#:  [id number]  DATE OF BIRTH:  1950-12-21  DATE OF ADMISSION:  05/17/2015 ADMITTING PHYSICIAN: Fritzi Mandes, MD  DATE OF DISCHARGE: 05/19/15  PRIMARY CARE PHYSICIAN: No primary care provider on file.    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 Cancer     liver  . 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 -Not a surgical candidate at this time. GI has been consulted. -Surgery following the patient. Continue IV antibiotics with Zosyn at this time.change to flagyl adn levaquin at discharge Since symptoms are improving - advance diet Patient can be discharged home with hospice  #2 Jaundice-likely secondary to worsening liver metastases, possible cholecystitis.  - Appreciate GI consult- extensive liver disease due to metastases- per GI no further treatment -On clear liquid diet - worsening jaundice- but symptoms improving- so advance diet  #3 stage IV bladder cancer-with liver metastases, worsening as noted on CT scan. 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 Appreciate palliative care consult. Patient is followed by hospice at home. Once his symptoms are improved he is to be discharged home with hospice services  #4 hypertension on Coreg  #5 Acute on chronic  anemia- due to malignancy, transfuse 1unit prior to discharge  DISCHARGE CONDITIONS:   Guarded  CONSULTS OBTAINED:  Treatment Team:  Lequita Asal, MD Manya Silvas, MD Molly Maduro, MD  DRUG ALLERGIES:   Allergies  Allergen Reactions  . Lisinopril Anaphylaxis  . Gabapentin Nausea Only  . Sulfa Antibiotics Rash    DISCHARGE MEDICATIONS:   Current Discharge Medication List    START taking these medications   Details  levofloxacin (LEVAQUIN) 500 MG tablet Take 1 tablet (500 mg total) by mouth daily. Qty: 7 tablet, Refills: 0    metroNIDAZOLE (FLAGYL) 500 MG tablet Take 1 tablet (500 mg total) by mouth 3 (three) times daily. Qty: 21 tablet, Refills: 0    prochlorperazine (COMPAZINE) 10 MG tablet Take 1 tablet (10 mg total) by mouth every 6 (six) hours as needed for nausea or vomiting. Qty: 20 tablet, Refills: 1      CONTINUE these medications which have NOT CHANGED   Details  ALPRAZolam (XANAX) 1 MG tablet Take 1 mg by mouth every 8 (eight) hours as needed for anxiety.    carvedilol (COREG) 6.25 MG tablet Take 6.25 mg by mouth 2 (two) times daily.    furosemide (LASIX) 20 MG tablet Take 20 mg by mouth daily.    methadone (DOLOPHINE) 10 MG tablet Take 1 tablet (10 mg total) by mouth 3 (three) times daily. Qty: 45 tablet, Refills: 0    omeprazole (PRILOSEC) 10 MG capsule Take 10 mg by mouth every morning.    oxyCODONE (OXY IR/ROXICODONE) 5 MG immediate release tablet 1-2 tablets every 4 hours as needed for pain Qty: 120 tablet, Refills:  0    promethazine (PHENERGAN) 25 MG tablet Take 25 mg by mouth every 6 (six) hours as needed for nausea or vomiting.      STOP taking these medications     simvastatin (ZOCOR) 40 MG tablet          DISCHARGE INSTRUCTIONS:   1. Home with hospice services 2. Outpatient palliative care follow up in 2 weeks  If you experience worsening of your admission symptoms, develop shortness of breath, life threatening  emergency, suicidal or homicidal thoughts you must seek medical attention immediately by calling 911 or calling your MD immediately  if symptoms less severe.  You Must read complete instructions/literature along with all the possible adverse reactions/side effects for all the Medicines you take and that have been prescribed to you. Take any new Medicines after you have completely understood and accept all the possible adverse reactions/side effects.   Please note  You were cared for by a hospitalist during your hospital stay. If you have any questions about your discharge medications or the care you received while you were in the hospital after you are discharged, you can call the unit and asked to speak with the hospitalist on call if the hospitalist that took care of you is not available. Once you are discharged, your primary care physician will handle any further medical issues. Please note that NO REFILLS for any discharge medications will be authorized once you are discharged, as it is imperative that you return to your primary care physician (or establish a relationship with a primary care physician if you do not have one) for your aftercare needs so that they can reassess your need for medications and monitor your lab values.    Today   CHIEF COMPLAINT:   Chief Complaint  Patient presents with  . Weakness     VITAL SIGNS:  Blood pressure 102/56, pulse 107, temperature 99 F (37.2 C), temperature source Oral, resp. rate 18, height 5\' 7"  (1.702 m), weight 81.829 kg (180 lb 6.4 oz), SpO2 98 %.  I/O:   Intake/Output Summary (Last 24 hours) at 05/19/15 1040 Last data filed at 05/19/15 0940  Gross per 24 hour  Intake   3535 ml  Output    750 ml  Net   2785 ml    PHYSICAL EXAMINATION:   Physical Exam  GENERAL: 64 y.o.-year-old patient lying in the bed with no acute distress.  EYES: Pupils equal, round, reactive to light and accommodation. Jaundiced sclerae. Extraocular  muscles intact.  HEENT: Head atraumatic, normocephalic. Oropharynx and nasopharynx clear.  NECK: Supple, no jugular venous distention. No thyroid enlargement, no tenderness.  LUNGS: Normal breath sounds bilaterally, no wheezing, rales,rhonchi or crepitation. No use of accessory muscles of respiration.  CARDIOVASCULAR: S1, S2 normal. No murmurs, rubs, or gallops.  ABDOMEN: Soft, distended abdomen, ventral hernia noted. There is colostomy bag noted in right lower quadrant. Bowel sounds present. No organomegaly or mass.  EXTREMITIES: No pedal edema, cyanosis, or clubbing.  NEUROLOGIC: Cranial nerves II through XII are intact. Muscle strength 5/5 in all extremities. Sensation intact. Gait not checked.  PSYCHIATRIC: The patient is alert and oriented x 3.  SKIN: No obvious rash, lesion, or ulcer.   DATA REVIEW:   CBC  Recent Labs Lab 05/19/15 0503  WBC 17.9*  HGB 6.5*  HCT 20.6*  PLT 138*    Chemistries   Recent Labs Lab 05/19/15 0503  NA 132*  K 3.8  CL 103  CO2 21*  GLUCOSE 79  BUN 21*  CREATININE 1.28*  CALCIUM 7.6*  AST 37  ALT 27  ALKPHOS 264*  BILITOT 7.1*    Cardiac Enzymes  Recent Labs Lab 05/17/15 1704  TROPONINI 0.03    Microbiology Results  Results for orders placed or performed in visit on 12/27/14  Urine culture     Status: None   Collection Time: 12/27/14  4:36 PM  Result Value Ref Range Status   Micro Text Report   Final       SOURCE: UROSTOMY    ORGANISM 1                -   COMMENT                   MIXED BACTERIAL ORGANISMS   COMMENT                   RESULTS SUGGESTIVE OF CONTAMINATION   ANTIBIOTIC                                                        RADIOLOGY:  Ct Abdomen Pelvis W Contrast  05/17/2015   CLINICAL DATA:  64 year old male with a history of dehydration. Known history of liver carcinoma.  EXAM: CT ABDOMEN AND PELVIS WITH CONTRAST  TECHNIQUE: Multidetector CT imaging of the abdomen and pelvis was performed  using the standard protocol following bolus administration of intravenous contrast.  CONTRAST:  158mL OMNIPAQUE IOHEXOL 300 MG/ML  SOLN  COMPARISON:  02/19/2015, 12/14/2014  FINDINGS: Lower chest:  Superficial soft tissues chest wall unremarkable.  Heart size within normal limits.  Catheter terminates within the right atrium, incompletely imaged.  Trace pericardial fluid/ thickening. Mediastinal lymph nodes in the lower mediastinum adjacent to the esophagus.  Small right-sided pleural effusion.  No confluent airspace disease.  Abdomen/ pelvis:  Extensive tumor involvement of the liver again demonstrated, with near complete replacement of the right liver lobe, and progression of disease within the left liver lobe. Portal vein appears to remain patent.  Unremarkable appearance of spleen.  Unremarkable appearance of pancreas.  Since the prior CT, there has been development of pericholecystic inflammatory changes/ fluid. No radiopaque gallstones present.  Unremarkable appearance of the right kidney. No hydronephrosis or nephrolithiasis. Course of the right ureter is similar to the comparison and unremarkable. Urostomy again noted, traversing the right anterior abdominal wall.  Similar appearance of the collecting system the left kidney, with pelvicaliectasis and dilation of the left ureter. This is similar when compared to the prior CT. No stones within the left-sided ureter.  Enteric contrast traverses the stomach and proximal small bowel. No transition point identified. No abnormally distended small bowel or colon. Large stool burden.  Since the prior, there has been progression of transverse colon herniation through a ventral wall hernia to the right of midline at the level of the liver (image 39). There does not appear to be obstruction secondary to this herniation.  Just inferior there is re- demonstration of herniated small bowel loops through a ventral wall defect in the midline, progressed from the comparison.  Contrast traverses, with no obstruction.  Similar compression of the cava at the level of the liver, though this appears patent.  Mesenteric edema with free fluid in the right pericolic gutter.  Vascular calcifications within the abdominal aorta and  iliac vessels. No aneurysm.  No free air. Free fluid layered dependently within the lower abdomen.  Borderline enlarged lymph nodes of the bilateral inguinal regions.  No acute fracture identified.  Multilevel degenerative changes.  IMPRESSION: Progression of liver disease, with near complete replacement of the right liver lobe, and progression in size in number of deposits within the left liver lobe.  Interval development of inflammatory changes surrounding the gallbladder, possibly representing acute cholecystitis. Correlation with patient presentation may be useful, and potentially HIDA study if imaging is required.  Mesenteric edema with small amount of free fluid, potentially reactive given changes of liver and gallbladder.  Progression of herniation of transverse colon through a ventral wall hernia to the right of midline at the level of the liver. No evidence of obstruction.  Progression of herniation of small bowel loops through ventral wall hernia just above the umbilicus, with no evidence of obstruction.  Surgical changes of prior urostomy formation, with similar degree of pelvicaliectasis of the left collecting system and dilation of the left ureter. If there is concern for UTI, correlation with urinalysis may be useful.  These results were called by telephone at the time of interpretation on 05/17/2015 at 8:39 pm to Dr. Loura Pardon , who verbally acknowledged these results.  Signed,  Dulcy Fanny. Earleen Newport, DO  [facility]  [facility]   Electronically Signed   By: Corrie Mckusick D.O.   On: 05/17/2015 20:40    EKG:   Orders placed or performed during the hospital encounter of 05/17/15  . ED EKG  . ED EKG  . EKG  12-Lead  . EKG 12-Lead      Management plans discussed with the patient, family and they are in agreement.  CODE STATUS:     Code Status Orders        Start     Ordered   05/18/15 0034  Do not attempt resuscitation (DNR)   Continuous    Question Answer Comment  In the event of cardiac or respiratory ARREST Do not call a "code blue"   In the event of cardiac or respiratory ARREST Do not perform Intubation, CPR, defibrillation or ACLS   In the event of cardiac or respiratory ARREST Use medication by any route, position, wound care, and other measures to relive pain and suffering. May use oxygen, suction and manual treatment of airway obstruction as needed for comfort.      05/18/15 0033    Advance Directive Documentation        Most Recent Value   Type of Advance Directive  Healthcare Power of Attorney, Living will   Pre-existing out of facility DNR order (yellow form or pink MOST form)     "MOST" Form in Place?        TOTAL TIME TAKING CARE OF THIS PATIENT: 40 minutes.    Gladstone Lighter M.D on 05/19/2015 at 10:40 AM  Between 7am to 6pm - Pager - [phone]  After 6pm go to [email or web address] - password [email or web address] [facility]  [facility]  Office  [phone]  CC: Primary care physician; No primary care provider on file.

## 2015-05-20 LAB — TYPE AND SCREEN
ABO/RH(D): B POS
Antibody Screen: NEGATIVE
Unit division: 0

## 2015-05-21 ENCOUNTER — Ambulatory Visit: Payer: Self-pay | Admitting: Internal Medicine

## 2015-05-23 ENCOUNTER — Other Ambulatory Visit: Payer: Self-pay | Admitting: *Deleted

## 2015-05-23 NOTE — Telephone Encounter (Signed)
Not needed Had to find rx at his sisters home

## 2015-05-24 ENCOUNTER — Other Ambulatory Visit: Payer: Self-pay | Admitting: Internal Medicine

## 2015-05-28 ENCOUNTER — Inpatient Hospital Stay: Attending: Internal Medicine | Admitting: Internal Medicine

## 2015-05-29 ENCOUNTER — Other Ambulatory Visit: Payer: Self-pay | Admitting: *Deleted

## 2015-05-29 MED ORDER — OXYCODONE HCL 10 MG PO TABS: ORAL_TABLET | ORAL | Status: AC

## 2015-05-29 MED ORDER — METHADONE HCL 10 MG PO TABS: 10.0000 mg | ORAL_TABLET | Freq: Three times a day (TID) | ORAL | Status: AC

## 2015-05-29 NOTE — Telephone Encounter (Signed)
Notified [facility] of increase in Oxycodone, Rx faxed

## 2015-06-08 DEATH — deceased
